# Patient Record
Sex: Female | Born: 1940 | Race: White | Hispanic: No | Marital: Single | Smoking: Never smoker
Health system: Southern US, Community
[De-identification: ages and names within clinical notes are randomized; demographics above are authoritative.]

## PROBLEM LIST (undated history)

## (undated) DIAGNOSIS — G4700 Insomnia, unspecified: Secondary | ICD-10-CM

## (undated) DIAGNOSIS — D649 Anemia, unspecified: Secondary | ICD-10-CM

## (undated) DIAGNOSIS — M1990 Unspecified osteoarthritis, unspecified site: Secondary | ICD-10-CM

## (undated) DIAGNOSIS — G8929 Other chronic pain: Secondary | ICD-10-CM

## (undated) DIAGNOSIS — F419 Anxiety disorder, unspecified: Secondary | ICD-10-CM

## (undated) DIAGNOSIS — M109 Gout, unspecified: Secondary | ICD-10-CM

## (undated) DIAGNOSIS — R569 Unspecified convulsions: Secondary | ICD-10-CM

## (undated) DIAGNOSIS — K5900 Constipation, unspecified: Secondary | ICD-10-CM

## (undated) DIAGNOSIS — F79 Unspecified intellectual disabilities: Secondary | ICD-10-CM

## (undated) DIAGNOSIS — E559 Vitamin D deficiency, unspecified: Secondary | ICD-10-CM

## (undated) DIAGNOSIS — I10 Essential (primary) hypertension: Secondary | ICD-10-CM

## (undated) DIAGNOSIS — S82891A Other fracture of right lower leg, initial encounter for closed fracture: Secondary | ICD-10-CM

## (undated) DIAGNOSIS — F32A Depression, unspecified: Secondary | ICD-10-CM

## (undated) DIAGNOSIS — F329 Major depressive disorder, single episode, unspecified: Secondary | ICD-10-CM

## (undated) DIAGNOSIS — E785 Hyperlipidemia, unspecified: Secondary | ICD-10-CM

## (undated) HISTORY — DX: Insomnia, unspecified: G47.00

## (undated) HISTORY — DX: Other chronic pain: G89.29

## (undated) HISTORY — DX: Anxiety disorder, unspecified: F41.9

## (undated) HISTORY — DX: Unspecified osteoarthritis, unspecified site: M19.90

## (undated) HISTORY — DX: Other fracture of right lower leg, initial encounter for closed fracture: S82.891A

## (undated) HISTORY — DX: Depression, unspecified: F32.A

## (undated) HISTORY — DX: Major depressive disorder, single episode, unspecified: F32.9

## (undated) HISTORY — DX: Unspecified convulsions: R56.9

## (undated) HISTORY — DX: Essential (primary) hypertension: I10

## (undated) HISTORY — DX: Unspecified intellectual disabilities: F79

## (undated) HISTORY — DX: Vitamin D deficiency, unspecified: E55.9

## (undated) HISTORY — DX: Hyperlipidemia, unspecified: E78.5

## (undated) HISTORY — DX: Anemia, unspecified: D64.9

## (undated) HISTORY — DX: Gout, unspecified: M10.9

## (undated) HISTORY — DX: Constipation, unspecified: K59.00

---

## 1997-08-06 ENCOUNTER — Emergency Department (HOSPITAL_COMMUNITY): Admission: EM | Admit: 1997-08-06 | Discharge: 1997-08-06 | Payer: Self-pay | Admitting: Emergency Medicine

## 1997-11-28 ENCOUNTER — Other Ambulatory Visit: Admission: RE | Admit: 1997-11-28 | Discharge: 1997-11-28 | Payer: Self-pay | Admitting: Family Medicine

## 1998-06-05 ENCOUNTER — Emergency Department (HOSPITAL_COMMUNITY): Admission: EM | Admit: 1998-06-05 | Discharge: 1998-06-05 | Payer: Self-pay | Admitting: Emergency Medicine

## 1998-12-11 ENCOUNTER — Emergency Department (HOSPITAL_COMMUNITY): Admission: EM | Admit: 1998-12-11 | Discharge: 1998-12-11 | Payer: Self-pay | Admitting: Emergency Medicine

## 1998-12-11 ENCOUNTER — Encounter: Payer: Self-pay | Admitting: Family Medicine

## 1998-12-26 ENCOUNTER — Ambulatory Visit (HOSPITAL_COMMUNITY): Admission: RE | Admit: 1998-12-26 | Discharge: 1998-12-26 | Payer: Self-pay | Admitting: Internal Medicine

## 1998-12-26 ENCOUNTER — Encounter: Payer: Self-pay | Admitting: Internal Medicine

## 1999-01-04 ENCOUNTER — Ambulatory Visit (HOSPITAL_COMMUNITY): Admission: RE | Admit: 1999-01-04 | Discharge: 1999-01-04 | Payer: Self-pay | Admitting: Internal Medicine

## 1999-01-04 ENCOUNTER — Encounter: Payer: Self-pay | Admitting: Internal Medicine

## 1999-02-19 ENCOUNTER — Ambulatory Visit (HOSPITAL_COMMUNITY): Admission: RE | Admit: 1999-02-19 | Discharge: 1999-02-19 | Payer: Self-pay | Admitting: Internal Medicine

## 1999-03-12 ENCOUNTER — Ambulatory Visit (HOSPITAL_COMMUNITY): Admission: RE | Admit: 1999-03-12 | Discharge: 1999-03-12 | Payer: Self-pay | Admitting: Internal Medicine

## 1999-03-12 ENCOUNTER — Encounter: Payer: Self-pay | Admitting: Internal Medicine

## 1999-05-21 ENCOUNTER — Encounter: Payer: Self-pay | Admitting: Family Medicine

## 1999-05-21 ENCOUNTER — Encounter: Admission: RE | Admit: 1999-05-21 | Discharge: 1999-05-21 | Payer: Self-pay | Admitting: Family Medicine

## 1999-05-30 ENCOUNTER — Encounter: Payer: Self-pay | Admitting: Family Medicine

## 1999-05-30 ENCOUNTER — Encounter: Admission: RE | Admit: 1999-05-30 | Discharge: 1999-05-30 | Payer: Self-pay | Admitting: Family Medicine

## 1999-06-17 ENCOUNTER — Other Ambulatory Visit: Admission: RE | Admit: 1999-06-17 | Discharge: 1999-06-17 | Payer: Self-pay | Admitting: Family Medicine

## 1999-06-21 ENCOUNTER — Encounter: Payer: Self-pay | Admitting: Family Medicine

## 1999-06-21 ENCOUNTER — Encounter: Admission: RE | Admit: 1999-06-21 | Discharge: 1999-06-21 | Payer: Self-pay | Admitting: Family Medicine

## 1999-07-10 ENCOUNTER — Encounter: Payer: Self-pay | Admitting: Family Medicine

## 1999-07-10 ENCOUNTER — Ambulatory Visit (HOSPITAL_COMMUNITY): Admission: RE | Admit: 1999-07-10 | Discharge: 1999-07-10 | Payer: Self-pay | Admitting: Family Medicine

## 1999-07-16 ENCOUNTER — Emergency Department (HOSPITAL_COMMUNITY): Admission: EM | Admit: 1999-07-16 | Discharge: 1999-07-16 | Payer: Self-pay | Admitting: Emergency Medicine

## 2000-07-17 ENCOUNTER — Encounter: Payer: Self-pay | Admitting: Family Medicine

## 2000-07-17 ENCOUNTER — Encounter: Admission: RE | Admit: 2000-07-17 | Discharge: 2000-07-17 | Payer: Self-pay | Admitting: Family Medicine

## 2000-08-31 ENCOUNTER — Other Ambulatory Visit: Admission: RE | Admit: 2000-08-31 | Discharge: 2000-08-31 | Payer: Self-pay | Admitting: Family Medicine

## 2001-05-26 ENCOUNTER — Emergency Department (HOSPITAL_COMMUNITY): Admission: EM | Admit: 2001-05-26 | Discharge: 2001-05-26 | Payer: Self-pay | Admitting: *Deleted

## 2001-06-30 ENCOUNTER — Encounter: Admission: RE | Admit: 2001-06-30 | Discharge: 2001-06-30 | Payer: Self-pay | Admitting: Family Medicine

## 2001-06-30 ENCOUNTER — Encounter: Payer: Self-pay | Admitting: Family Medicine

## 2001-11-19 ENCOUNTER — Ambulatory Visit (HOSPITAL_COMMUNITY): Admission: RE | Admit: 2001-11-19 | Discharge: 2001-11-19 | Payer: Self-pay | Admitting: Internal Medicine

## 2002-07-04 ENCOUNTER — Encounter: Admission: RE | Admit: 2002-07-04 | Discharge: 2002-07-04 | Payer: Self-pay | Admitting: Family Medicine

## 2002-07-04 ENCOUNTER — Encounter: Payer: Self-pay | Admitting: Family Medicine

## 2003-07-07 ENCOUNTER — Encounter: Admission: RE | Admit: 2003-07-07 | Discharge: 2003-07-07 | Payer: Self-pay | Admitting: Family Medicine

## 2003-09-14 ENCOUNTER — Ambulatory Visit (HOSPITAL_COMMUNITY): Admission: RE | Admit: 2003-09-14 | Discharge: 2003-09-14 | Payer: Self-pay | Admitting: Internal Medicine

## 2004-04-08 ENCOUNTER — Encounter (INDEPENDENT_AMBULATORY_CARE_PROVIDER_SITE_OTHER): Payer: Self-pay | Admitting: *Deleted

## 2004-04-08 ENCOUNTER — Ambulatory Visit (HOSPITAL_BASED_OUTPATIENT_CLINIC_OR_DEPARTMENT_OTHER): Admission: RE | Admit: 2004-04-08 | Discharge: 2004-04-08 | Payer: Self-pay | Admitting: Obstetrics and Gynecology

## 2004-04-08 ENCOUNTER — Ambulatory Visit (HOSPITAL_COMMUNITY): Admission: RE | Admit: 2004-04-08 | Discharge: 2004-04-08 | Payer: Self-pay | Admitting: Obstetrics and Gynecology

## 2004-08-08 ENCOUNTER — Encounter: Admission: RE | Admit: 2004-08-08 | Discharge: 2004-08-08 | Payer: Self-pay | Admitting: Family Medicine

## 2005-08-11 ENCOUNTER — Encounter: Admission: RE | Admit: 2005-08-11 | Discharge: 2005-08-11 | Payer: Self-pay | Admitting: Family Medicine

## 2006-02-18 ENCOUNTER — Emergency Department (HOSPITAL_COMMUNITY): Admission: EM | Admit: 2006-02-18 | Discharge: 2006-02-19 | Payer: Self-pay | Admitting: Emergency Medicine

## 2006-02-23 ENCOUNTER — Ambulatory Visit: Payer: Self-pay | Admitting: Family Medicine

## 2006-02-23 ENCOUNTER — Inpatient Hospital Stay (HOSPITAL_COMMUNITY): Admission: EM | Admit: 2006-02-23 | Discharge: 2006-02-26 | Payer: Self-pay | Admitting: Emergency Medicine

## 2006-02-24 ENCOUNTER — Encounter (INDEPENDENT_AMBULATORY_CARE_PROVIDER_SITE_OTHER): Payer: Self-pay | Admitting: Cardiology

## 2006-03-20 ENCOUNTER — Ambulatory Visit (HOSPITAL_COMMUNITY): Admission: RE | Admit: 2006-03-20 | Discharge: 2006-03-20 | Payer: Self-pay | Admitting: Internal Medicine

## 2006-09-11 ENCOUNTER — Encounter: Admission: RE | Admit: 2006-09-11 | Discharge: 2006-09-11 | Payer: Self-pay | Admitting: Internal Medicine

## 2006-09-12 ENCOUNTER — Emergency Department (HOSPITAL_COMMUNITY): Admission: EM | Admit: 2006-09-12 | Discharge: 2006-09-12 | Payer: Self-pay | Admitting: Emergency Medicine

## 2006-09-21 ENCOUNTER — Encounter: Admission: RE | Admit: 2006-09-21 | Discharge: 2006-09-21 | Payer: Self-pay | Admitting: Internal Medicine

## 2007-09-22 ENCOUNTER — Encounter: Admission: RE | Admit: 2007-09-22 | Discharge: 2007-09-22 | Payer: Self-pay | Admitting: Family Medicine

## 2008-01-11 ENCOUNTER — Encounter
Admission: RE | Admit: 2008-01-11 | Discharge: 2008-01-11 | Payer: Self-pay | Admitting: Physical Medicine and Rehabilitation

## 2008-05-04 IMAGING — CR DG FEMUR 2+V*R*
4 series · 4 of 4 positions shown · non-contrast
Comparison: none

CLINICAL DATA: Fall, midshaft pain

RIGHT FEMUR - 2  VIEW:

[t femur with hip  ap right]
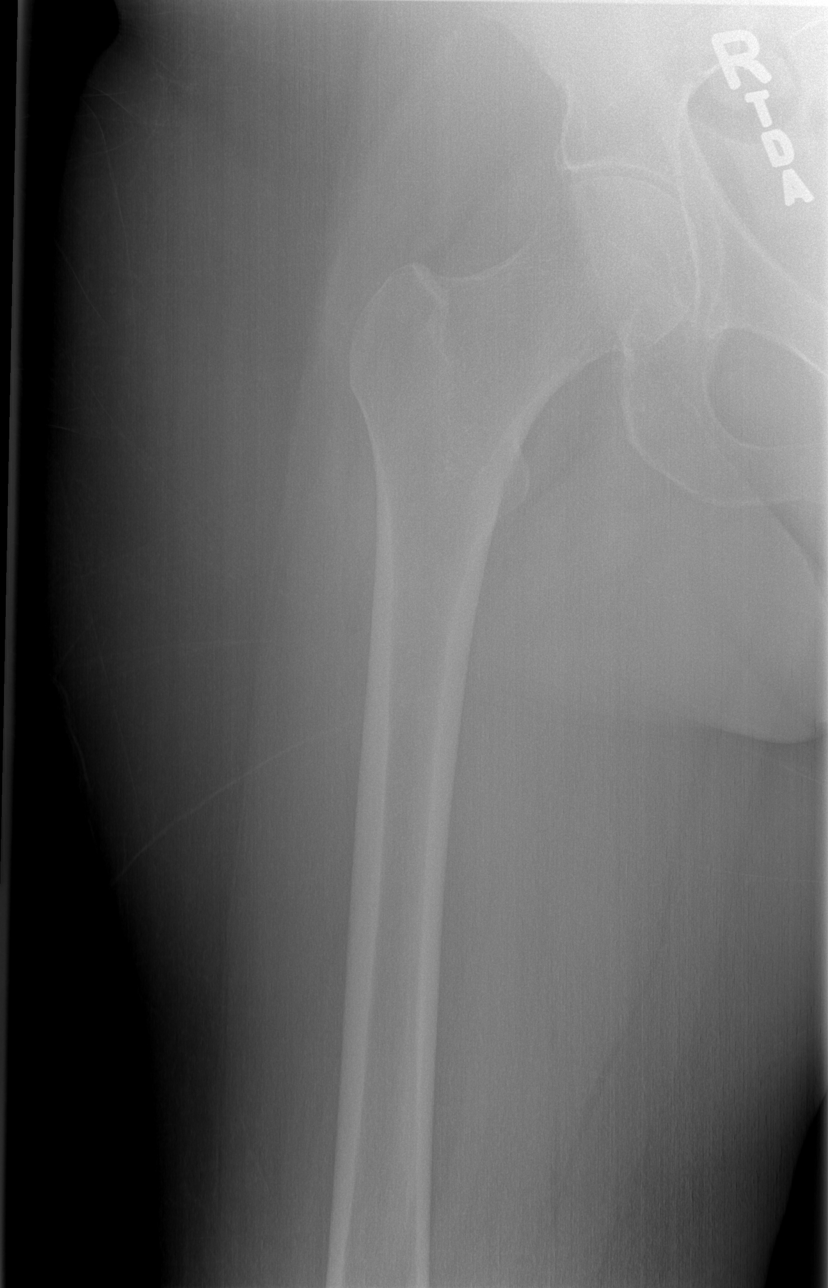

[t femur with knee ap right]
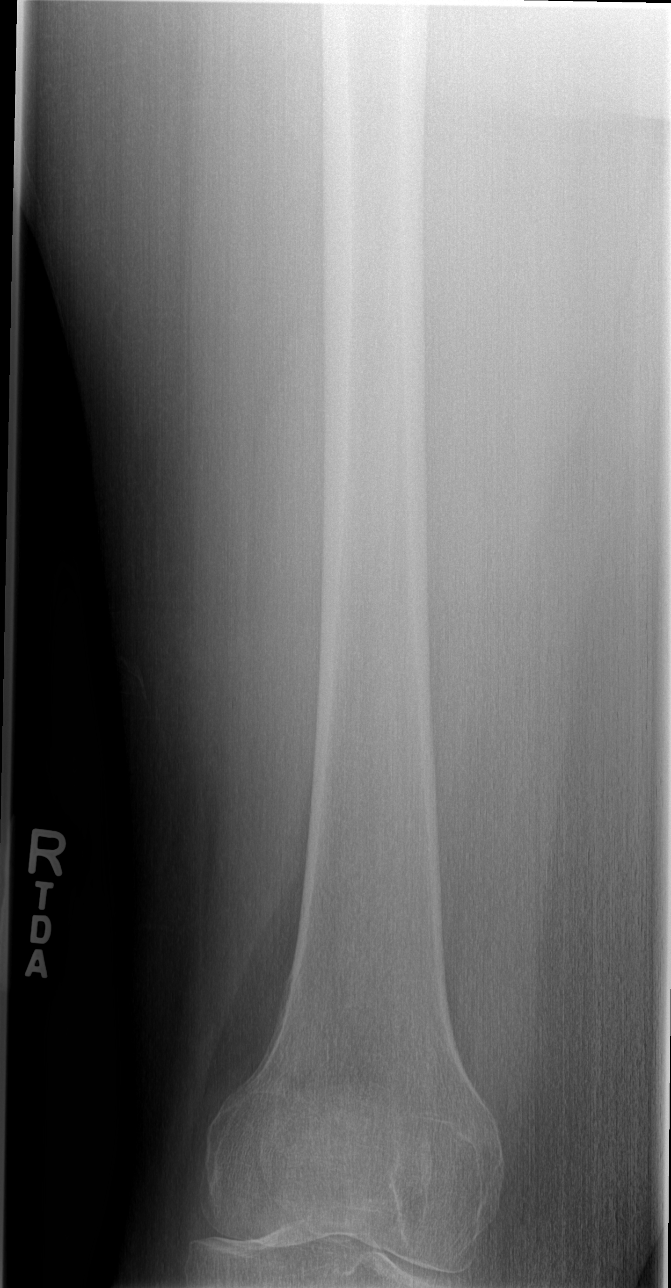

[t femur with hip lat right *]
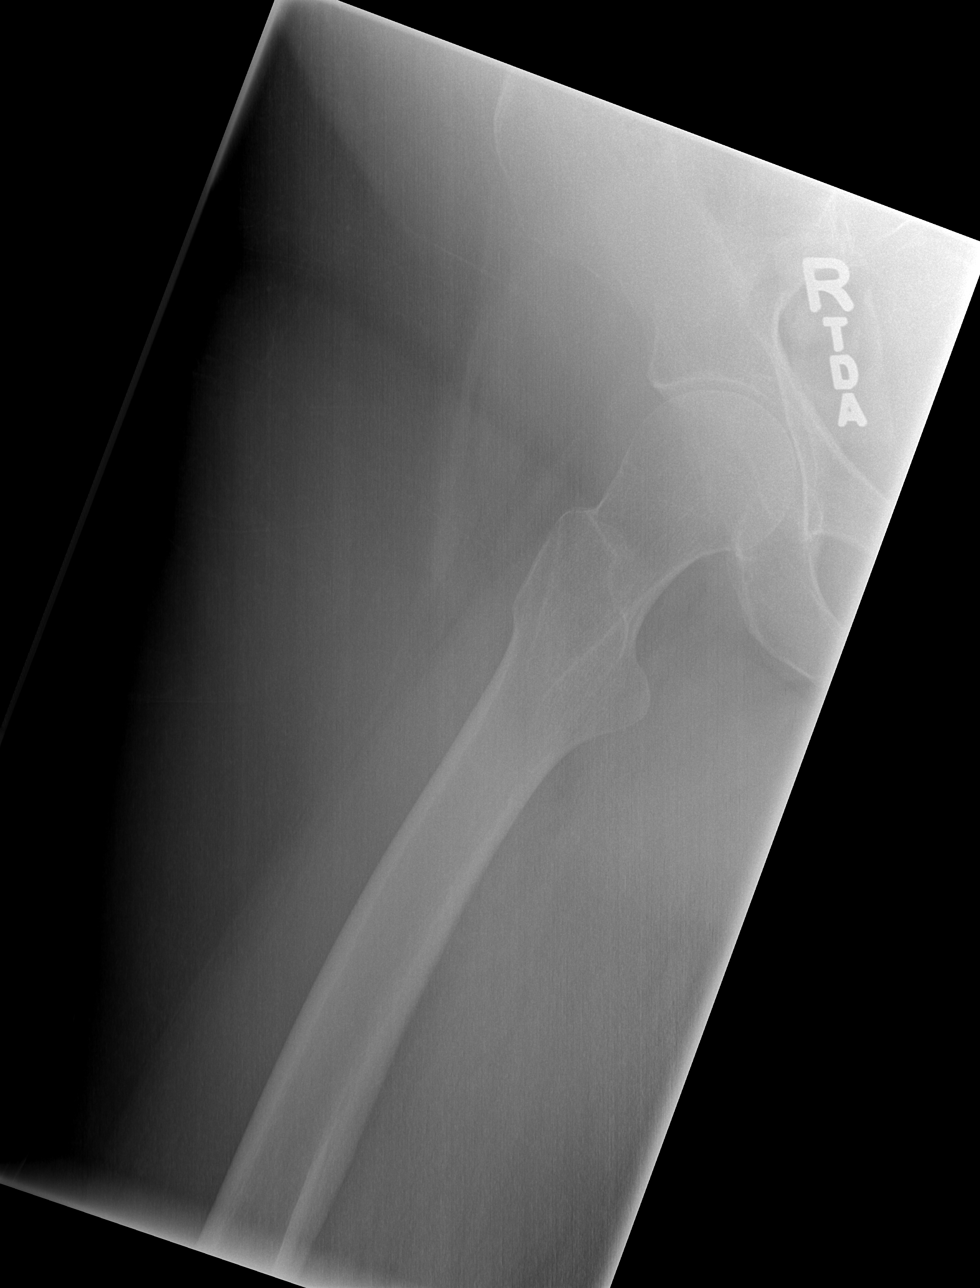

[t femur with knee lat right]
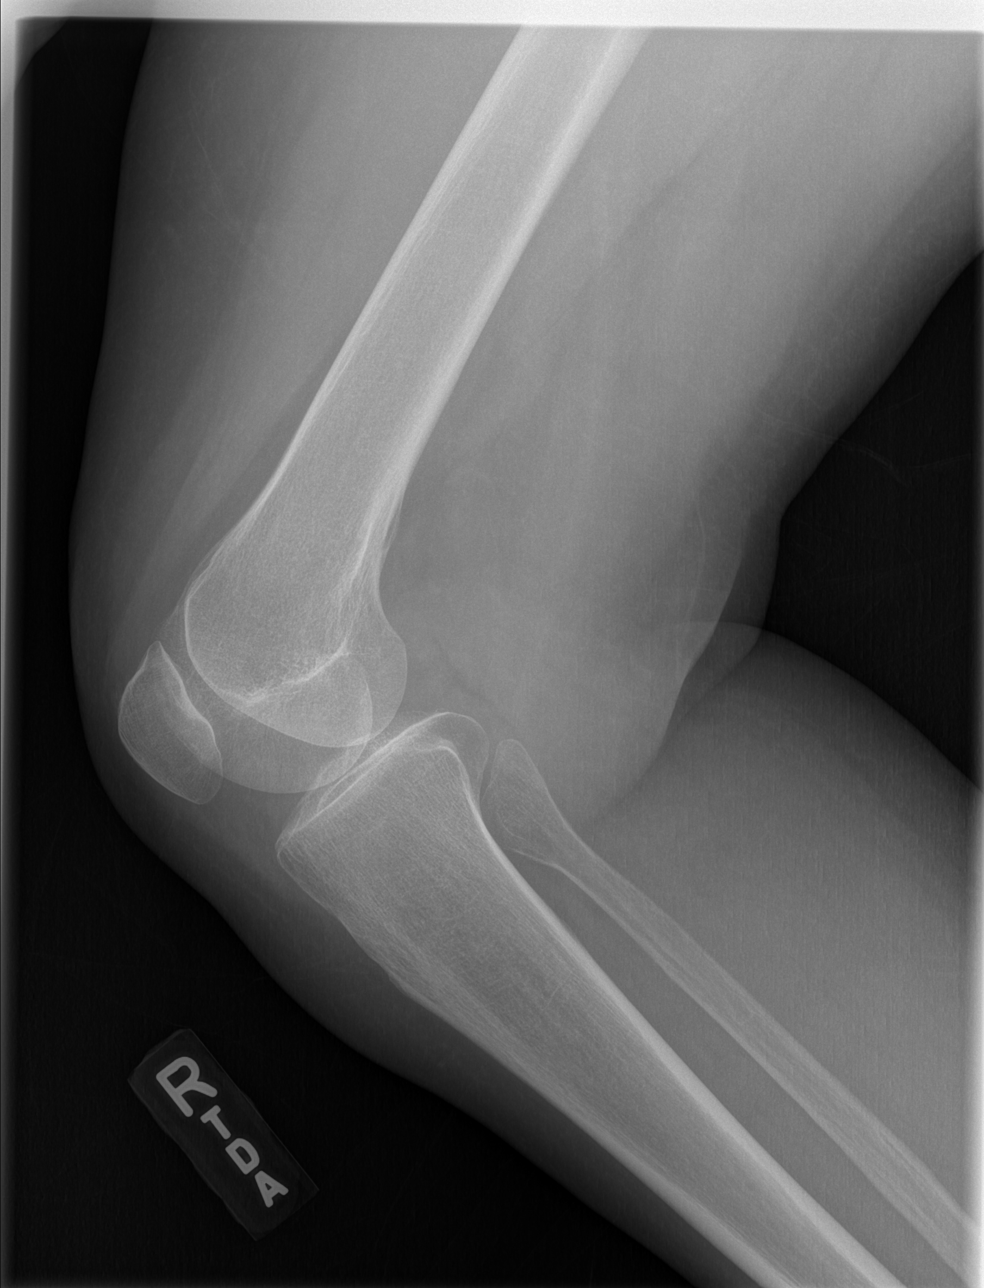

[4 of 4 positions shown; findings below may reference images not displayed]

FINDINGS: There is no evidence of fracture or other focal bone lesions.  Soft
tissues are unremarkable.
IMPRESSION: Negative.

## 2008-05-04 IMAGING — CR DG HIP COMPLETE 2+V*R*
2 series · 2 of 2 positions shown · non-contrast
Comparison: none

CLINICAL DATA: Fall, right hip pain

RIGHT HIP - 2  VIEW:

[t pelvis a.p.]
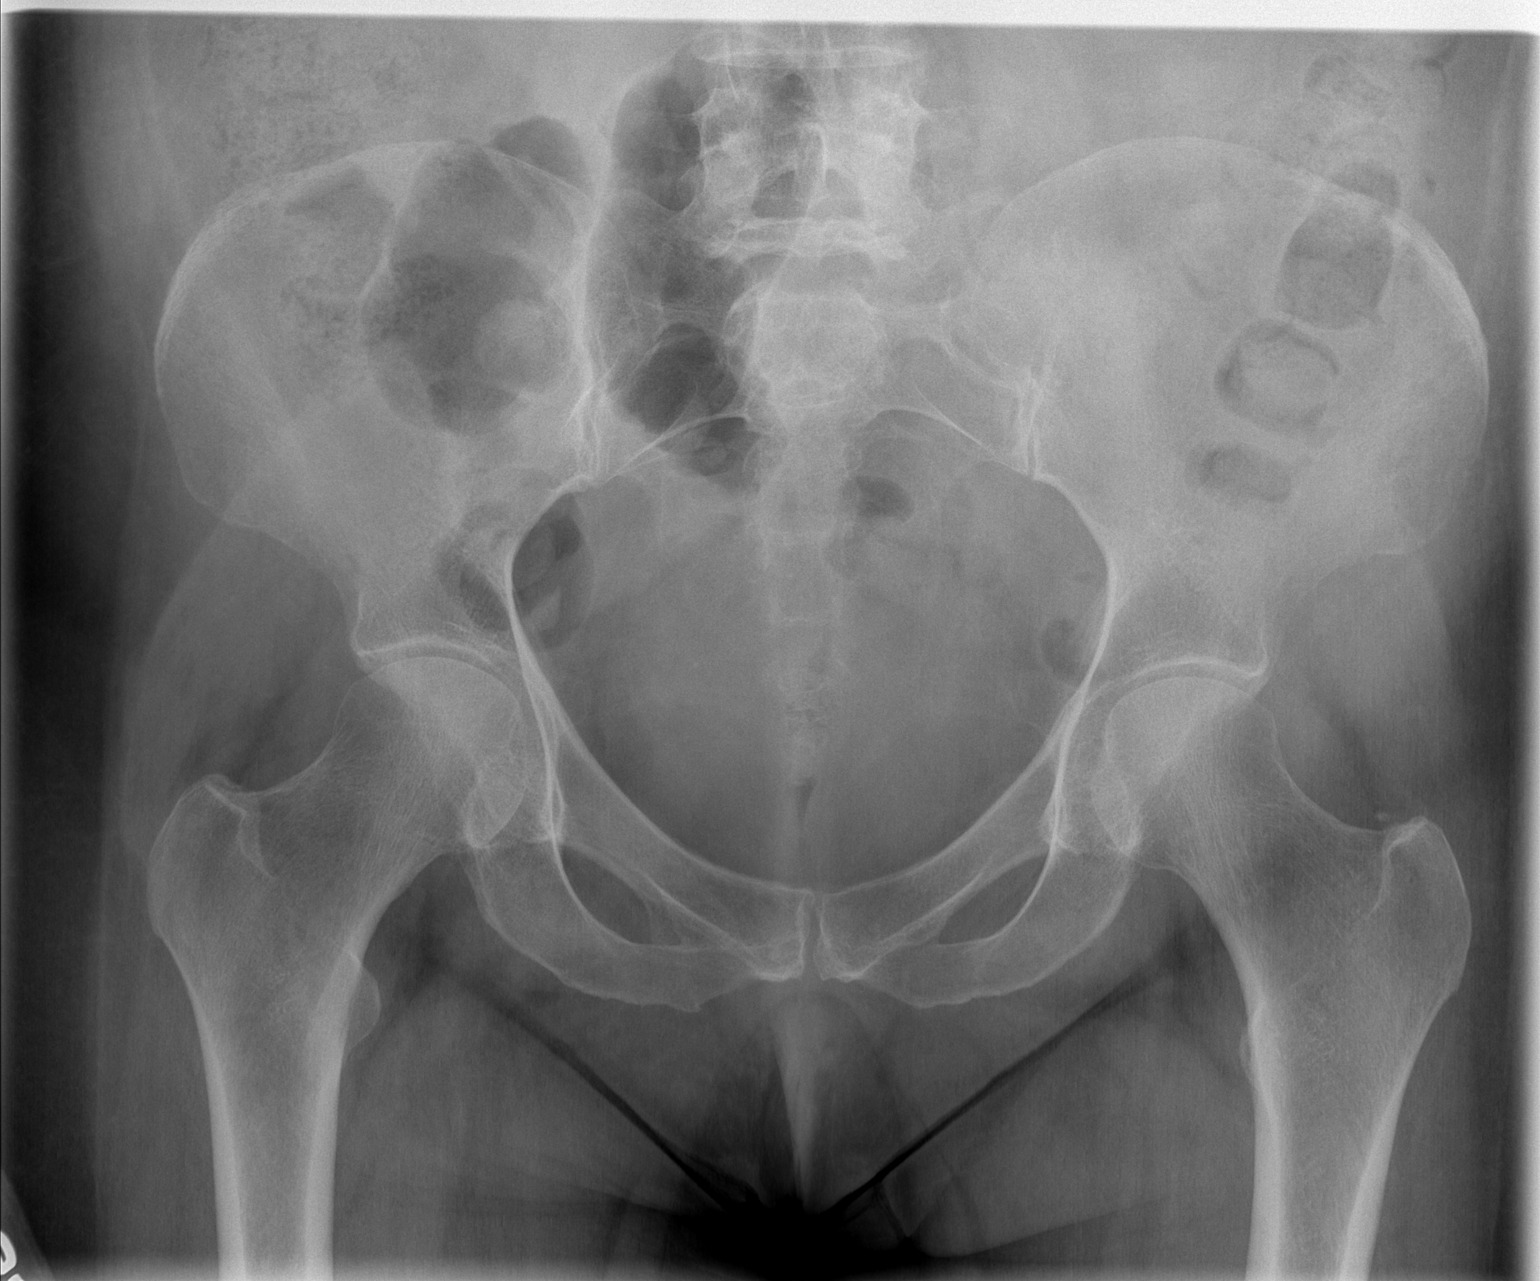

[t hip ap right]
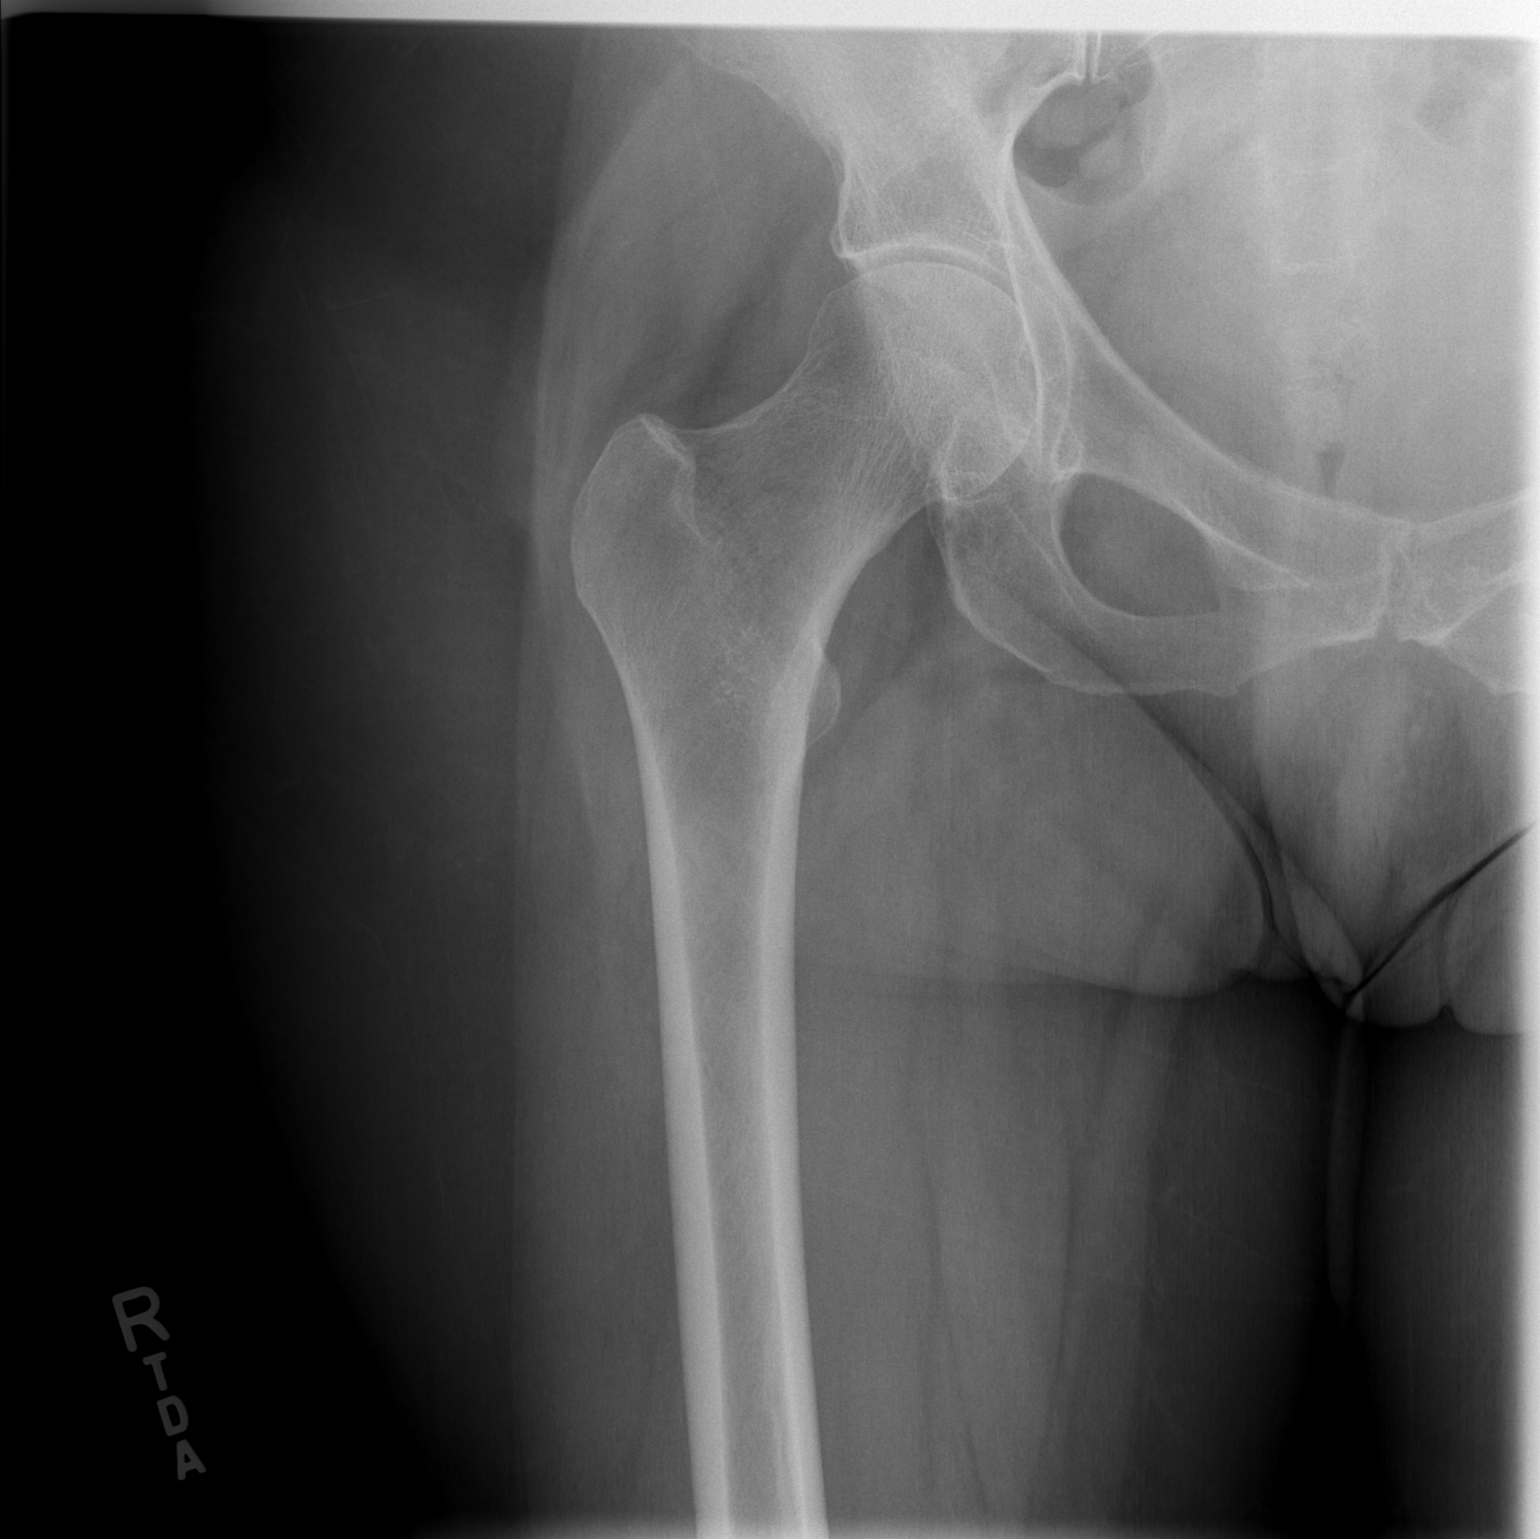

[2 of 2 positions shown; findings below may reference images not displayed]

FINDINGS: There is no evidence of hip fracture or dislocation.  There is no
evidence of arthropathy or other focal bone abnormality.
IMPRESSION: Negative.

## 2008-09-22 ENCOUNTER — Encounter: Admission: RE | Admit: 2008-09-22 | Discharge: 2008-09-22 | Payer: Self-pay | Admitting: Internal Medicine

## 2008-10-20 ENCOUNTER — Inpatient Hospital Stay (HOSPITAL_COMMUNITY): Admission: EM | Admit: 2008-10-20 | Discharge: 2008-10-23 | Payer: Self-pay | Admitting: Emergency Medicine

## 2009-11-02 ENCOUNTER — Encounter: Admission: RE | Admit: 2009-11-02 | Discharge: 2009-11-02 | Payer: Self-pay | Admitting: Internal Medicine

## 2010-04-21 ENCOUNTER — Encounter: Payer: Self-pay | Admitting: Physical Medicine and Rehabilitation

## 2010-06-13 IMAGING — CR DG TIBIA/FIBULA 2V*R*
2 series · 2 of 2 positions shown · non-contrast
Comparison: 10/20/2008.

CLINICAL DATA: Post reduction.

RIGHT TIBIA AND FIBULA - 2 VIEW

[view not recorded (1 of 2)]
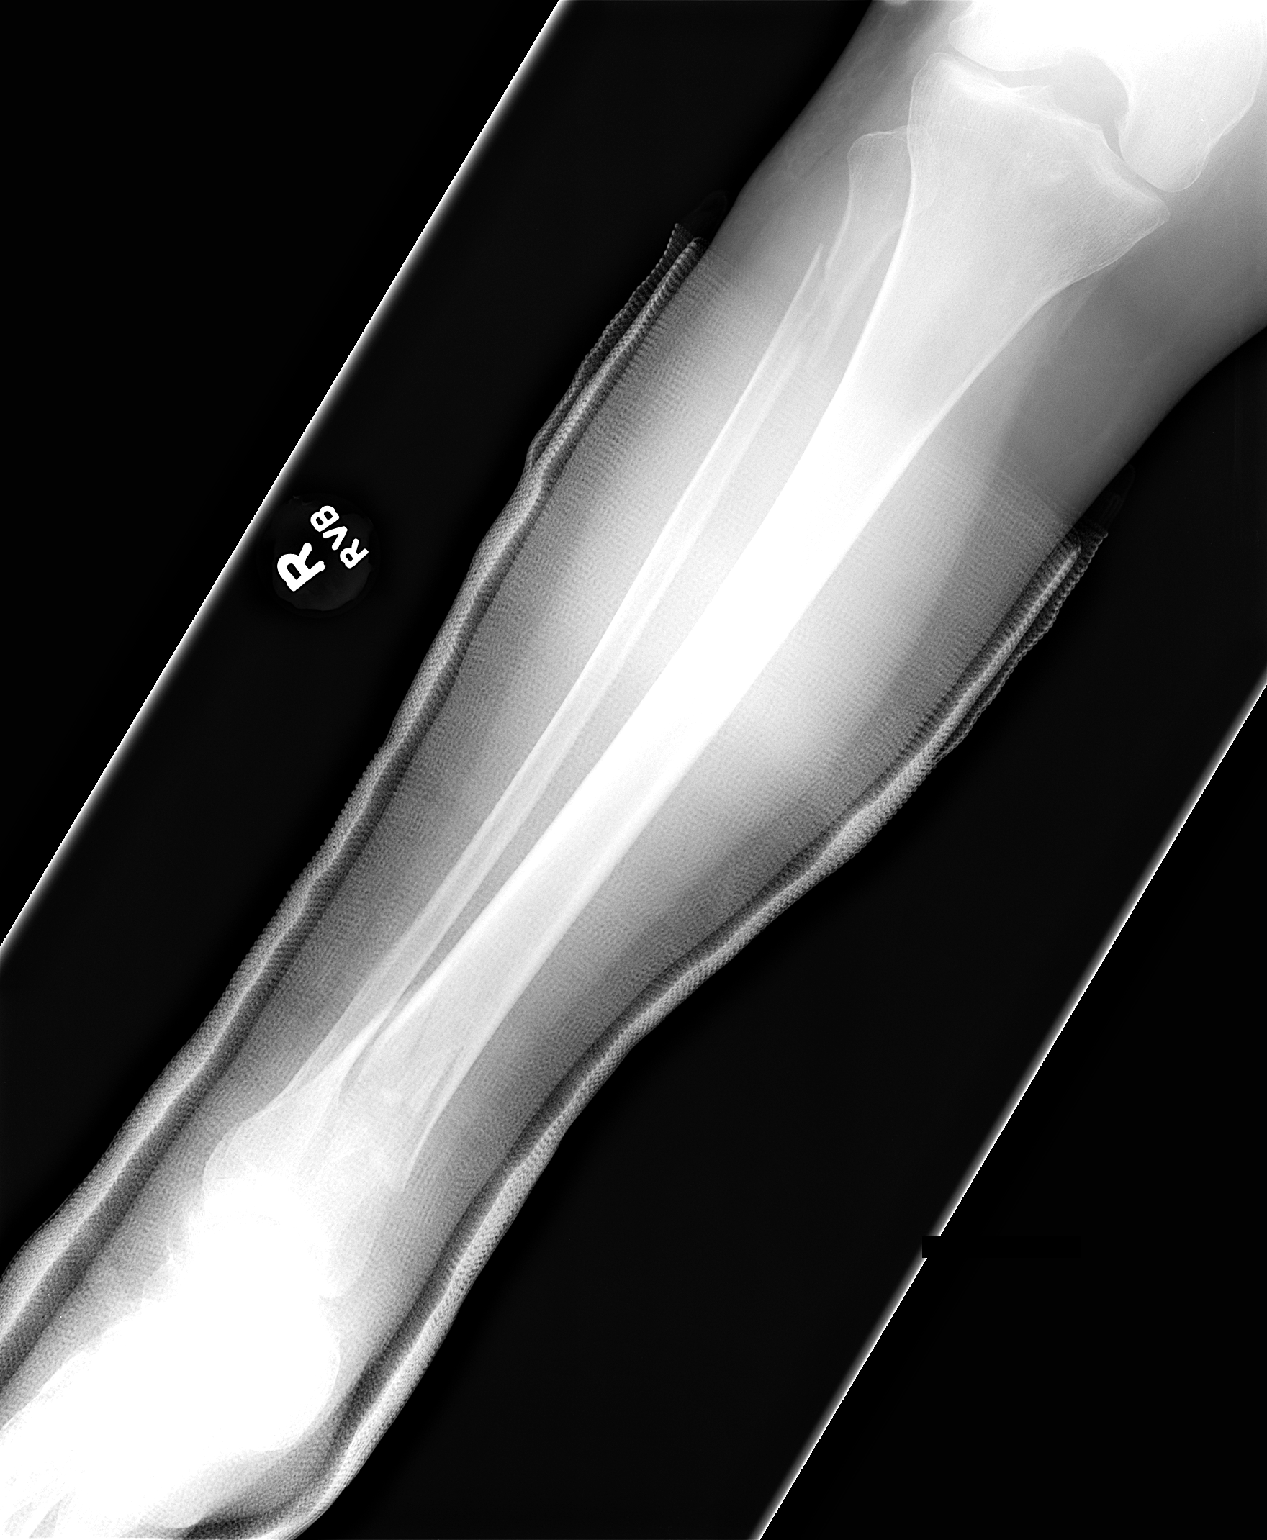

[view not recorded (2 of 2)]
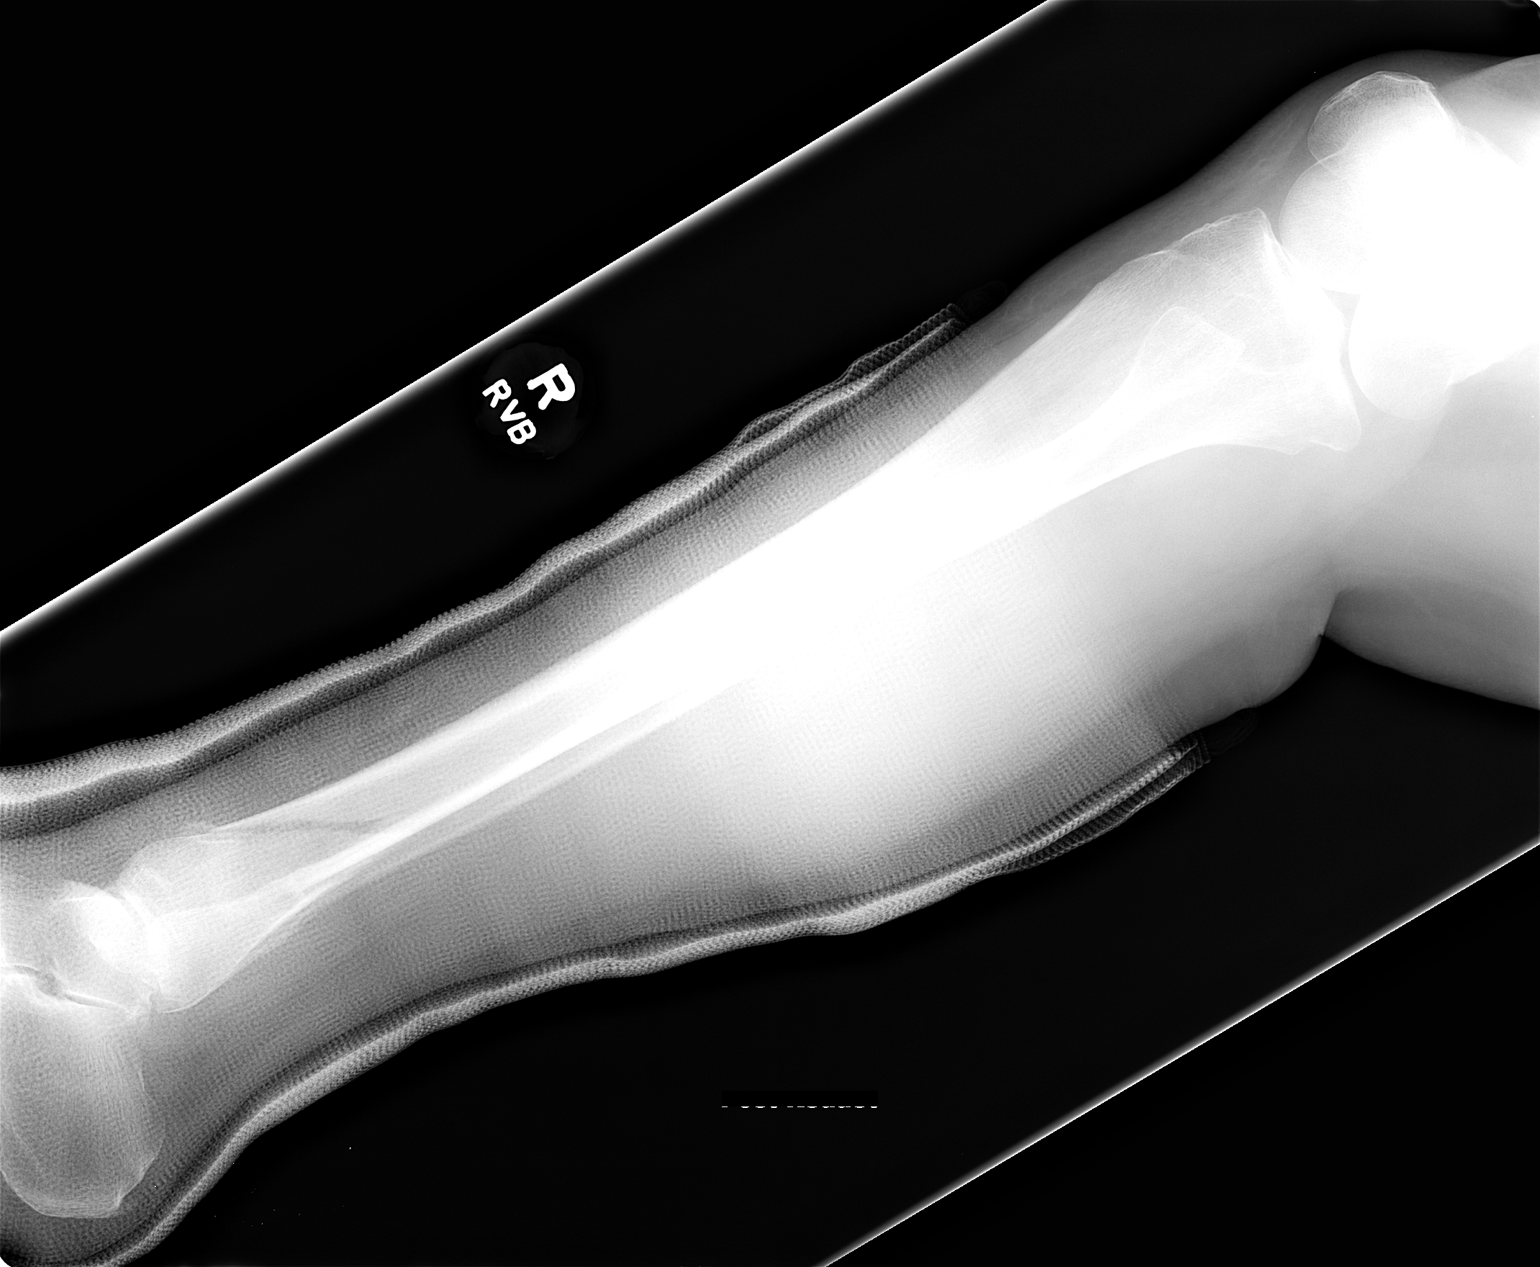

[2 of 2 positions shown; findings below may reference images not displayed]

FINDINGS: A fiberglass cast is in place.  There is a mildly
displaced fracture of the proximal fibula, unchanged.  There is a
comminuted fracture of the distal tibia.  There is improvement in
the lateral displacement of the tibial fracture.
IMPRESSION: Improved displacement of the distal tibial fracture following
reduction and casting.  No change in the mildly displaced proximal
fibular fracture.

## 2010-07-07 LAB — CBC
MCV: 98.4 fL (ref 78.0–100.0)
RBC: 3.92 MIL/uL (ref 3.87–5.11)
WBC: 7.3 10*3/uL (ref 4.0–10.5)

## 2010-07-07 LAB — BASIC METABOLIC PANEL
Chloride: 100 mEq/L (ref 96–112)
Creatinine, Ser: 0.87 mg/dL (ref 0.4–1.2)
GFR calc Af Amer: >60 mL/min (ref >60)
Potassium: 4.3 mEq/L (ref 3.5–5.1)
Sodium: 139 mEq/L (ref 135–145)

## 2010-07-07 LAB — DIFFERENTIAL
Eosinophils Absolute: 0.1 10*3/uL (ref 0.0–0.7)
Lymphs Abs: 1.6 10*3/uL (ref 0.7–4.0)
Monocytes Relative: 5 % (ref 3–12)
Neutrophils Relative %: 71 % (ref 43–77)

## 2010-07-07 LAB — PROTIME-INR: INR: 1 (ref 0.00–1.49)

## 2010-07-07 LAB — TYPE AND SCREEN
ABO/RH(D): O NEG
Antibody Screen: NEGATIVE

## 2010-08-13 NOTE — Discharge Summary (Signed)
NAMETRISTIN, GLADMAN               ACCOUNT NO.:  [id number]   MEDICAL RECORD NO.:  [id number]          PATIENT TYPE:  INP   LOCATION:  1602                         FACILITY:  [facility]   PHYSICIAN:  Mark C. Ophelia Charter, M.D.    DATE OF BIRTH:  1941-03-11   DATE OF ADMISSION:  10/20/2008  DATE OF DISCHARGE:  10/23/2008                               DISCHARGE SUMMARY   FINAL DIAGNOSIS:  Fall with right tibia and fibula fracture.   This is a 70 year old female with mild mental retardation who is a  skilled nursing facility resident was admitted with injury to the right  ankle when she slipped and fell.  X-rays taken in the emergency room  demonstrated a tib-fib fracture with proximal fibula fracture just below  the knee, and a distal tibial metaphyseal fracture which did not have  intraarticular extension.  The patient basically does transfers.   ADMISSION MEDICATIONS:  Reviewed for medical reconciliation and all  medications that she was on under his help were ordered with the  exception of the Vicodin 1 p.o. q.6 p.r.n. pain.  This was increased to  1-2 p.o. q.6 h. p.r.n. pain.  She initially also received IV pain  medication if needed.  In the emergency room, a short-leg fiberglass  cast was applied.  She is admitted to the hospital for elevation should  no problems with compartment syndrome.  Tolerated the cast well and had  good relief of pain with the cast.   CONDITION AT DISCHARGE:  Improved.   MEDICATIONS AT DISCHARGE:  Exactly the same as admission to the nursing  home which includes Carbatrol, phenobarbital, Colace, Lasix, Oxytrol,  allopurinol, pindolol, vitamin D, calcium, trazodone, Indocin, Toprol,  and p.r.n. Tylenol.  Hydrocodone will be 1-2 p.o. q.6 h. p.r.n. for pain  x3 weeks, then she can go back to her normal p.r.n. hydrocodone 1 every  6 hours.  She needed to be seen in the office of Dr. Ophelia Charter in 3 weeks,  office number is [phone].  She will have to be monitored, make  sure she  does not develop constipation with the narcotic pain medicine.  She will  need to have her foot elevated above her heart when she is in bed  propped up on some pillows to help with the swelling associated with the  fracture.  When she returns to my office in three weeks, we will obtain  x-rays, check cast fit, and assess alignment.  Condition at discharge  was stable.   PROCEDURES:  Reduction and casting.      Mark C. Ophelia Charter, M.D.  Electronically Signed     MCY/MEDQ  D:  10/22/2008  T:  10/22/2008  Job:  [id number]

## 2010-08-16 NOTE — Op Note (Signed)
[name], [name]               ACCOUNT NO.:  [id number]   MEDICAL RECORD NO.:  [id number]           PATIENT TYPE:   LOCATION:                                 FACILITY:   PHYSICIAN:  Cynthia P. Romine, M.D.DATE OF BIRTH:  [birth year]   DATE OF PROCEDURE:  DATE OF DISCHARGE:                                 OPERATIVE REPORT   PREOPERATIVE DIAGNOSES:  1.  Postmenopausal bleeding.  2.  Known endometrial polyp.   POSTOPERATIVE DIAGNOSES:  1.  Postmenopausal bleeding.  2.  Known endometrial polyp.  3.  Pathology pending.   PROCEDURES:  Hysteroscopic resection of endometrial polyp.   SURGEON:  Cynthia P. Romine, M.D.   ANESTHESIA:  General.   ESTIMATED BLOOD LOSS:  Minimal.   SORBITOL DEFICIT:  30 mL.   COMPLICATIONS:  None.   DESCRIPTION OF PROCEDURE:  The patient was taken to the operating room and  after the induction of general anesthesia was placed in the dorsolithotomy  position and prepped and draped in the usual fashion.  Her bladder was  drained with a __________ catheter.  A posterior weighted and anterior  Simons retractor were placed and the cervix was grasped at the anterior lip  with a single-tooth tenaculum.  The cervix was dilated to a #21 Shawnie Pons.  The  uterus was sounded to 7 cm.  The operative hysteroscope was introduced.  Sorbitol was used as a distention medium and the Sorbitol pressure was set  at 70 mmHg.  Hysteroscopy revealed there to be an anterior fundal polyp as  had been seen on sonohysterogram.  The polyp was resected in several pieces  using a single loop without difficulty.  The rest of the endometrium  appeared very atrophic.  Upon completion of the removal of the polyp, the  hysteroscope was withdrawn.  The instruments were removed from the vagina  and the procedure was terminated.  The patient tolerated it well and went in  satisfactory condition to postanesthesia recovery.       ___________________________________________  Edwena Felty.  Romine, M.D.    CPR/MEDQ  D:  04/08/2004  T:  04/08/2004  Job:  [id number]

## 2010-08-16 NOTE — Discharge Summary (Signed)
NAMEJOSCLYN, [name]               ACCOUNT NO.:  [id number]   MEDICAL RECORD NO.:  [id number]          PATIENT TYPE:  INP   LOCATION:  3705                         FACILITY:  [facility]   PHYSICIAN:  Leighton Roach McDiarmid, M.D.DATE OF BIRTH:  [birth year]   DATE OF ADMISSION:  02/23/2006  DATE OF DISCHARGE:  02/25/2006                                 DISCHARGE SUMMARY   DISCHARGE DIAGNOSES:  1. Dizziness, likely secondary to vertigo.  2. Mental retardation.  3. Seizure disorder.  4. Status post rib fracture in November 2007.   DISCHARGE MEDICATIONS:  1. Carbatrol 300 mg by mouth b.i.d.  2. Klonopin 0.5 mg p.o. once daily as needed.  3. Isordil Extended Release 30 mg p.o. daily.  4. Meclizine 25 mg by mouth once daily.  Please note, this is a decreased      dose from the patient's admission dose.  5. Phenobarbital 97.5 mg p.o. q.h.s.  6. Oxybutynin 5 mg p.o. every 6 hours.  7. Colace 100 mg p.o. daily.   CONSULTATIONS:  None.   PROCEDURES:  The patient had a chest x-ray that showed right basilar  atelectasis versus infiltrate.  There was soft tissue prominence in the  right hilar region but could not rule out mass or adenopathy.  A follow-up  chest x-ray is recommended.  The patient also had a CT of the head on  admission that showed no acute findings.   FOLLOW-UP INSTRUCTIONS:  The patient is being discharged to a skilled  nursing facility for short-term placement.  She should follow up with her  primary physician, who is Windle Guard, M.D., and has been instructed to  call to make an appointment.  At that time she should have a follow-up chest  x-ray given her hilar prominence.   HOSPITAL COURSE:  Problem 1.  [name] is a 70 year old white female with a longstanding  history of mental retardation and a seizure disorder.  She was admitted for  a 2-week history of unsteady gait secondary to dizziness.  Upon further  questioning she stated that the dizziness felt that the room  was spinning.  She had been started on meclizine approximately 3 weeks prior to admission  at a dose of 25 mg every 6 hours.  This dose was decreased to 25 mg daily  and she reported much improved dizziness at the time of discharge.  Meclizine itself at a higher dose may contribute to dizziness given its  anticholinergic effects.  She had a head CT to rule out any neurological  cause for her dizziness.  This study was negative.  Also her tympanic  membranes were clear with no evidence of acute otitis media to cause her  dizziness.  Orthostatic blood pressures were negative.  She also had an  echocardiogram that showed an ejection fraction of 60-70% with no wall  motion abnormalities.  Of note, there was mild to moderate pulmonic valve  regurgitation and a small pericardial effusion.  At the time of discharge  the patient is slightly unsteady on her feet and requires help with  ambulation.  She is also  incontinent of urine, which has been her baseline  for the past month.   Problem 2.  SEIZURE DISORDER:  The patient was continued on her home  medication regimen.  Her primary neurologist is Dr. Thad Ranger.  Her  phenobarbital level was within the therapeutic range at 26.7.  She will be  continued on her home medications as recommended by Dr. Thad Ranger.     ______________________________  Sylvan Cheese, M.D.    ______________________________  Leighton Roach McDiarmid, M.D.    MJ/MEDQ  D:  02/25/2006  T:  02/25/2006  Job:  [id number]   cc:   Windle Guard, M.D.  Michael L. Thad Ranger, M.D.

## 2010-08-16 NOTE — H&P (Signed)
[name], [name]               ACCOUNT NO.:  [id number]   MEDICAL RECORD NO.:  [id number]          PATIENT TYPE:  INP   LOCATION:  3705                         FACILITY:  [facility]   PHYSICIAN:  Benn Moulder, M.D.      DATE OF BIRTH:  Oct 11, 1940   DATE OF ADMISSION:  02/23/2006  DATE OF DISCHARGE:                                HISTORY & PHYSICAL   PRIMARY CARE PHYSICIAN:  Windle Guard, MD.   NEUROLOGIST:  Dr. Thad Ranger.   CHIEF COMPLAINT:  Weakness.   HISTORY OF PRESENT ILLNESS:  Patient is a 70 year old female with a history  of mental retardation and seizure disorder who presented to the emergency  department today because of increasing weakness.  For the past several  months she has been becoming increasingly weaker with frequent falls.  She  had a fall 2 months ago in which she fractured her right ankle.  For the  past week she has become even increasingly more weak to the point of being  unable to ambulate.  She had a fall on February 19, 2006, and went to the  emergency department that night and was noted to have right-sided eighth and  ninth rib fractures.  She has had pain on her right side since that time but  has been without any respiratory difficulty.  Her baseline mental status is  very poor and she needs assistance with most of her ADLs.  She is cared for  by her 89 year old mother who is too weak to continue caring for the patient  with this new weakness.  In regard to the patient's seizures, she has a  history of atonic seizures.  Her last seizure was 2 weeks ago.  She had been  seizure free for almost 1 year on phenobarbital prior to this last seizure.  Her falls have not been associated with seizure.  She has had no mental  status changes and no loss of consciousness before, during, or after the  seizures.   REVIEW OF SYSTEMS:  No chest pain, no shortness of breath, no abdominal  pain, no edema, no increasing urinary frequency, no incontinence, no melena,  hematochezia, no fevers.  Patient does have irregular bowel movements.   PAST MEDICAL HISTORY:  1. Mental retardation since childhood.  2. Seizure disorder.  3. Ankle fracture 2 months ago.  4. Urinary tract infection 2 weeks ago; she finished a course of      antibiotics.   MEDICATIONS:  Of note, do not have current dosages.  1. Phenobarbital.  2. Clonazepam.  3. Meclizine.  4. Medroxyprogesterone.  5. Premarin.  6. Vicodin as needed.   ALLERGIES:  NO KNOWN DRUG ALLERGIES.   FAMILY HISTORY:  Mom and sister with diabetes mellitus.  Dad died of  pancreatic cancer.  A sister also has hypertension.   SOCIAL HISTORY:  She lives with her mom as above.  Her sister also lives in  [place].  She has no history of alcohol, tobacco, or other drug use.   PHYSICAL EXAM:  VITALS:  Temperature 98.6, pulse 62, blood pressure 160/70,  respiratory rate  18, 98% on room air.  GENERAL:  She is alert and oriented to person, place, but not date.  HEENT:  Mucous membranes are moist.  NECK:  No lymphadenopathy.  No JVD.  No thyromegaly.  No thyroid nodules.  CARDIOVASCULAR:  Regular rate and rhythm.  Positive 2/6 systolic ejection  murmur over aortic area.  LUNGS:  Clear to auscultation bilaterally.  Good air movement.  No increased  work of breathing.  ABDOMEN:  Positive bowel sounds.  Soft, nontender, nondistended.  Abdomen is  protuberant but not tympanotic.  EXTREMITIES:  No signs of clubbing or edema, 2+ dorsalis pedis and posterior  tibial pulses bilaterally.  SKIN:  Positive ecchymosis on back and buttocks.  NEURO:  Cranial nerves II through XII grossly intact.  Patient with 5/5  upper and lower extremity strength throughout.  2+ DTRs.  Sensation is  intact.  Finger to nose intact.   LABORATORY DATA:  Phenobarbital level 27.6.  White blood cell count 5.4,  hemoglobin 14.3, platelet count 295.  Sodium 133, potassium 4.6, chloride  96, bicarb 29, BUN 17, creatinine 0.7, glucose 105.   Urinalysis:  Negative  nitrite, negative leukocyte esterase, specific gravity of 1.019.   EKG showed normal sinus rhythm at 61 beats/minute without ST segment  changes.   CT of head showed no acute findings.  It did show positive generalized  atrophy.   Chest x-ray showed right basilar atelectasis versus infiltrate as well as  soft tissue prominence of right hilum.   CT of chest on November 22nd showed cardiomegaly with pulmonary venous  hypertension.  Small nondisplaced fractures were noted at right eighth and  ninth ribs.  There was also some passive atelectasis.   ASSESSMENT AND PLAN:  A 70 year old female with mental retardation, seizure  disorder, now with weakness.  1. Weakness.  No obvious source of this based on exam and labs.  We will      consult Physical Therapy and Occupational Therapy.  She will need      placement, so we will consult Social Work.  We will also check cardiac      enzymes x1 as well as TSH and follow telemetry overnight to rule out      other possible causes.  We will place the patient on fall precautions.  2. Seizure disorder.  Family describes atonic seizures.  We will obtain      medication list from her primary care physician.  Her phenobarbital      level is currently therapeutic.  She has had all of her medications      this morning.  We will touch base with Dr. Thad Ranger tomorrow morning.  3. Cardiovascular.  Systolic murmur, __________ aortic stenosis.  Patient      also has pulmonary venous hypertension on CT (computed tomography) of      her chest last week as well as cardiomegaly.  We will check a 2-D      echocardiogram.  Also, checking cardiac enzymes and monitor with      telemetry as above.  4. Rib fractures.  We will use nonsteroidal antiinflammatories as needed      for pain control.  5. Constipation.  Colace 100 mg once daily.  DISPOSITION:  Once workup complete, patient will likely need placement in a  nursing home or a short-term  skilled nursing facility.      Benn Moulder, M.D.     MR/MEDQ  D:  02/23/2006  T:  02/24/2006  Job:  [id number]   cc:   Windle Guard, M.D.  Michael L. Thad Ranger, M.D.

## 2010-12-19 ENCOUNTER — Emergency Department (HOSPITAL_COMMUNITY)
Admission: EM | Admit: 2010-12-19 | Discharge: 2010-12-19 | Disposition: A | Discharge status: Home / Self Care | Payer: Medicare Other | Attending: Emergency Medicine | Admitting: Emergency Medicine

## 2010-12-19 DIAGNOSIS — Z79899 Other long term (current) drug therapy: Secondary | ICD-10-CM | POA: Insufficient documentation

## 2010-12-19 DIAGNOSIS — R32 Unspecified urinary incontinence: Secondary | ICD-10-CM | POA: Insufficient documentation

## 2010-12-19 DIAGNOSIS — G40909 Epilepsy, unspecified, not intractable, without status epilepticus: Secondary | ICD-10-CM | POA: Insufficient documentation

## 2010-12-19 DIAGNOSIS — F79 Unspecified intellectual disabilities: Secondary | ICD-10-CM | POA: Insufficient documentation

## 2010-12-19 LAB — CBC
HCT: 38.9 % (ref 36.0–46.0)
MCH: 34.6 pg — ABNORMAL HIGH (ref 26.0–34.0)
MCHC: 36.5 g/dL — ABNORMAL HIGH (ref 30.0–36.0)
MCV: 94.9 fL (ref 78.0–100.0)
Platelets: 204 10*3/uL (ref 150–400)
RDW: 12.6 % (ref 11.5–15.5)
WBC: 7.2 10*3/uL (ref 4.0–10.5)

## 2010-12-19 LAB — URINE MICROSCOPIC-ADD ON

## 2010-12-19 LAB — BASIC METABOLIC PANEL
BUN: 13 mg/dL (ref 6–23)
Calcium: 10.3 mg/dL (ref 8.4–10.5)
Chloride: 94 mEq/L — ABNORMAL LOW (ref 96–112)
Creatinine, Ser: 0.8 mg/dL (ref 0.50–1.10)
GFR calc Af Amer: >60 mL/min (ref >60)

## 2010-12-19 LAB — URINALYSIS, ROUTINE W REFLEX MICROSCOPIC
Glucose, UA: NEGATIVE mg/dL
Hgb urine dipstick: NEGATIVE
Ketones, ur: NEGATIVE mg/dL
pH: 7 (ref 5.0–8.0)

## 2010-12-19 LAB — GLUCOSE, CAPILLARY: Glucose-Capillary: 98 mg/dL (ref 70–99)

## 2012-05-03 ENCOUNTER — Other Ambulatory Visit: Payer: Self-pay | Admitting: Internal Medicine

## 2012-05-03 DIAGNOSIS — Z1231 Encounter for screening mammogram for malignant neoplasm of breast: Secondary | ICD-10-CM

## 2012-05-21 ENCOUNTER — Ambulatory Visit: Payer: Medicare Other

## 2012-06-16 ENCOUNTER — Ambulatory Visit
Admission: RE | Admit: 2012-06-16 | Discharge: 2012-06-16 | Disposition: A | Discharge status: Home / Self Care | Payer: Medicare Other | Source: Ambulatory Visit | Attending: Internal Medicine | Admitting: Internal Medicine

## 2012-08-03 ENCOUNTER — Non-Acute Institutional Stay (SKILLED_NURSING_FACILITY): Payer: Medicare Other | Admitting: Adult Health

## 2012-08-03 DIAGNOSIS — R569 Unspecified convulsions: Secondary | ICD-10-CM

## 2012-08-03 DIAGNOSIS — G8929 Other chronic pain: Secondary | ICD-10-CM

## 2012-08-03 DIAGNOSIS — E785 Hyperlipidemia, unspecified: Secondary | ICD-10-CM

## 2012-08-03 DIAGNOSIS — I10 Essential (primary) hypertension: Secondary | ICD-10-CM

## 2012-08-03 DIAGNOSIS — E1149 Type 2 diabetes mellitus with other diabetic neurological complication: Secondary | ICD-10-CM

## 2012-08-03 DIAGNOSIS — R609 Edema, unspecified: Secondary | ICD-10-CM

## 2012-08-03 DIAGNOSIS — K5900 Constipation, unspecified: Secondary | ICD-10-CM

## 2012-08-03 DIAGNOSIS — M109 Gout, unspecified: Secondary | ICD-10-CM

## 2012-09-10 ENCOUNTER — Other Ambulatory Visit: Payer: Self-pay | Admitting: Geriatric Medicine

## 2012-09-10 MED ORDER — PHENOBARBITAL 32.4 MG PO TABS: ORAL_TABLET | ORAL | Status: DC

## 2012-10-05 ENCOUNTER — Non-Acute Institutional Stay (SKILLED_NURSING_FACILITY): Payer: Medicare Other | Admitting: Adult Health

## 2012-10-05 ENCOUNTER — Encounter: Payer: Self-pay | Admitting: Adult Health

## 2012-10-05 DIAGNOSIS — K5900 Constipation, unspecified: Secondary | ICD-10-CM | POA: Insufficient documentation

## 2012-10-05 DIAGNOSIS — I10 Essential (primary) hypertension: Secondary | ICD-10-CM

## 2012-10-05 DIAGNOSIS — R569 Unspecified convulsions: Secondary | ICD-10-CM | POA: Insufficient documentation

## 2012-10-05 DIAGNOSIS — E785 Hyperlipidemia, unspecified: Secondary | ICD-10-CM | POA: Insufficient documentation

## 2012-10-05 DIAGNOSIS — E1149 Type 2 diabetes mellitus with other diabetic neurological complication: Secondary | ICD-10-CM | POA: Insufficient documentation

## 2012-10-05 DIAGNOSIS — F329 Major depressive disorder, single episode, unspecified: Secondary | ICD-10-CM

## 2012-10-05 DIAGNOSIS — G8929 Other chronic pain: Secondary | ICD-10-CM | POA: Insufficient documentation

## 2012-10-05 DIAGNOSIS — M109 Gout, unspecified: Secondary | ICD-10-CM | POA: Insufficient documentation

## 2012-10-05 DIAGNOSIS — R609 Edema, unspecified: Secondary | ICD-10-CM

## 2012-10-05 MED ORDER — MIRTAZAPINE 15 MG PO TABS: 15.0000 mg | ORAL_TABLET | Freq: Every day | ORAL | Status: DC

## 2012-10-05 NOTE — Progress Notes (Signed)
Patient ID: [name], female   DOB: 1940-10-15, 72 y.o.   MRN: [id number]  STARM[facility] Allergies  Allergen Reactions  . Chocolate      Chief Complaint  Patient presents with  . Medical Managment of Chronic Issues    HPI: She is being seen for the management for her chronic illnesses she is voicing no complaints today. She is stable; staff voices no concerns as well.   Past Medical History  Diagnosis Date  . Gout   . Vitamin D deficiency   . Hyperlipidemia   . Hypertension   . Unspecified constipation   . Insomnia   . Chronic pain   . Seizure   . Closed right ankle fracture   . Intellectual disability   . Arthritis   . Anxiety   . Depression   . Anemia     History reviewed. No pertinent past surgical history.  VITAL SIGNS BP 128/70  Pulse 70  Ht 5\' 6"  (1.676 m)  Wt 203 lb (92.08 kg)  BMI 32.78 kg/m2   Patient's Medications  New Prescriptions   No medications on file  Previous Medications   ALLOPURINOL (ZYLOPRIM) 100 MG TABLET    Take 200 mg by mouth daily.   CALCIUM-VITAMIN D (OSCAL WITH D) 500-200 MG-UNIT PER TABLET    Take 1 tablet by mouth 3 (three) times daily.   CARBAMAZEPINE (TEGRETOL XR) 100 MG 12 HR TABLET    Take 300 mg by mouth 2 (two) times daily.   CYCLOBENZAPRINE (FLEXERIL) 5 MG TABLET    Take 5 mg by mouth 2 (two) times daily.   DOCUSATE SODIUM (COLACE) 100 MG CAPSULE    Take 100 mg by mouth daily.   FUROSEMIDE (LASIX) 20 MG TABLET    Take 20 mg by mouth daily.   GABAPENTIN (NEURONTIN) 100 MG CAPSULE    Take 100 mg by mouth 4 (four) times daily.   HYDROCODONE-ACETAMINOPHEN (NORCO/VICODIN) 5-325 MG PER TABLET    Take 1 tablet by mouth daily as needed for pain.   IBUPROFEN (ADVIL,MOTRIN) 400 MG TABLET    Take 400 mg by mouth every 6 (six) hours as needed for pain.   INSULIN GLARGINE (LANTUS) 100 UNIT/ML INJECTION    Inject 10 Units into the skin at bedtime.   LORATADINE (CLARITIN) 10 MG TABLET    Take 10 mg by mouth daily as needed for  allergies.   METFORMIN (GLUCOPHAGE) 1000 MG TABLET    Take 1,000 mg by mouth 2 (two) times daily with a meal.   METOPROLOL SUCCINATE (TOPROL-XL) 25 MG 24 HR TABLET    Take 25 mg by mouth 2 (two) times daily.   PHENOBARBITAL (LUMINAL) 32.4 MG TABLET    Take three tablets by mouth at bedtime.   PRAVASTATIN (PRAVACHOL) 40 MG TABLET    Take 40 mg by mouth daily.   VITAMIN D, ERGOCALCIFEROL, (DRISDOL) 50000 UNITS CAPS    Take 50,000 Units by mouth every 30 (thirty) days.  Modified Medications   No medications on file  Discontinued Medications   No medications on file    SIGNIFICANT DIAGNOSTIC EXAMS   06-02-12: wbc 6.0; hgb 13.3; hct 38.2; mcv 92.9; plt 247; glucose 105; bun 15; creat 0.78; k+ 4.3;  Na++ 137; liver normal albumin 4.1; hgb a1c 6.0 07-06-12: tegretol 7.6; phenobarbital 9.1   Review of Systems  Constitutional: Negative for malaise/fatigue.  Respiratory: Negative for cough and shortness of breath.   Cardiovascular: Negative for chest pain.  Gastrointestinal: Negative for heartburn  and constipation.  Musculoskeletal: Negative for myalgias and joint pain.  Skin: Negative.   Neurological: Negative for headaches.  Psychiatric/Behavioral: Negative for depression. The patient does not have insomnia.     Physical Exam  Constitutional: She appears well-developed and well-nourished.  overweight  Neck: Neck supple. No JVD present. No thyromegaly present.  Cardiovascular: Normal rate, regular rhythm and intact distal pulses.   Respiratory: Effort normal and breath sounds normal. No respiratory distress.  GI: Soft. Bowel sounds are normal. She exhibits no distension.  Musculoskeletal: Normal range of motion. She exhibits no edema.  Neurological: She is alert.  Skin: Skin is warm and dry.      ASSESSMENT/ PLAN:  Type II or unspecified type diabetes mellitus with neurological manifestations, not stated as uncontrolled(250.60) Is stable will continue lantus 10 units daily and  metformin 1 gm twice daily   Gout Will continue allopurinol 200 mg daily   Seizures No seizure activity; will continue phenobarbital 97.2 mg nightly; tegretol er 300 mg twice daily   Essential hypertension, benign Will continue toprol xl 25 mg twice daily   Edema Is stable will continue lasix 20 mg daily and will monitor  Other and unspecified hyperlipidemia Will continue pravachol 40 mg daily   Chronic pain She is voicing no pain in her legs at this time; will continue flexeril 5 mg twice daily for leg cramps; neurontin 100 mg four times daily; vicodin 5/325 mg daily as needed will monitor  Unspecified constipation Will continue colace daily

## 2012-10-05 NOTE — Assessment & Plan Note (Signed)
She is voicing no pain in her legs at this time; will continue flexeril 5 mg twice daily for leg cramps; neurontin 100 mg four times daily; vicodin 5/325 mg daily as needed will monitor

## 2012-10-05 NOTE — Assessment & Plan Note (Signed)
Will continue pravachol 40 mg daily

## 2012-10-05 NOTE — Assessment & Plan Note (Signed)
No seizure activity; will continue phenobarbital 97.2 mg nightly; tegretol er 300 mg twice daily

## 2012-10-05 NOTE — Assessment & Plan Note (Signed)
Will continue colace daily  

## 2012-10-05 NOTE — Progress Notes (Signed)
Patient ID: [name], female   DOB: [birth year], 72 y.o.   MRN: [id number] Patient ID: [name], female   DOB: 1940/04/05, 72 y.o.   MRN: [id number]  [facility] Allergies  Allergen Reactions  . Chocolate      Chief Complaint  Patient presents with  . Medical Managment of Chronic Issues    HPI: She is being seen for the management for her chronic illnesses. She is complaining of increased depression and sadness which has lasted for an extended period of time; she would like for this to be addressed   Past Medical History  Diagnosis Date  . Gout   . Vitamin D deficiency   . Hyperlipidemia   . Hypertension   . Unspecified constipation   . Insomnia   . Chronic pain   . Seizure   . Closed right ankle fracture   . Intellectual disability   . Arthritis   . Anxiety   . Depression   . Anemia     No past surgical history on file.  VITAL SIGNS There were no vitals taken for this visit.   Patient's Medications  New Prescriptions   No medications on file  Previous Medications   ALLOPURINOL (ZYLOPRIM) 100 MG TABLET    Take 200 mg by mouth daily.   CALCIUM-VITAMIN D (OSCAL WITH D) 500-200 MG-UNIT PER TABLET    Take 1 tablet by mouth 3 (three) times daily.   CARBAMAZEPINE (TEGRETOL XR) 100 MG 12 HR TABLET    Take 300 mg by mouth 2 (two) times daily.   CYCLOBENZAPRINE (FLEXERIL) 5 MG TABLET    Take 5 mg by mouth 2 (two) times daily.   DOCUSATE SODIUM (COLACE) 100 MG CAPSULE    Take 100 mg by mouth daily.   FUROSEMIDE (LASIX) 20 MG TABLET    Take 20 mg by mouth daily.   GABAPENTIN (NEURONTIN) 100 MG CAPSULE    Take 100 mg by mouth 4 (four) times daily.   IBUPROFEN (ADVIL,MOTRIN) 400 MG TABLET    Take 400 mg by mouth every 6 (six) hours as needed for pain.   INSULIN GLARGINE (LANTUS) 100 UNIT/ML INJECTION    Inject 10 Units into the skin at bedtime.   LORATADINE (CLARITIN) 10 MG TABLET    Take 10 mg by mouth daily as needed for allergies.   METFORMIN (GLUCOPHAGE) 1000 MG  TABLET    Take 1,000 mg by mouth 2 (two) times daily with a meal.   METOPROLOL SUCCINATE (TOPROL-XL) 25 MG 24 HR TABLET    Take 25 mg by mouth 2 (two) times daily.   PHENOBARBITAL (LUMINAL) 32.4 MG TABLET    Take three tablets by mouth at bedtime.   PRAVASTATIN (PRAVACHOL) 40 MG TABLET    Take 40 mg by mouth daily.   VITAMIN D, ERGOCALCIFEROL, (DRISDOL) 50000 UNITS CAPS    Take 50,000 Units by mouth every 30 (thirty) days.  Modified Medications   No medications on file  Discontinued Medications   HYDROCODONE-ACETAMINOPHEN (NORCO/VICODIN) 5-325 MG PER TABLET    Take 1 tablet by mouth daily as needed for pain.    SIGNIFICANT DIAGNOSTIC EXAMS   06-02-12: wbc 6.0; hgb 13.3; hct 38.2; mcv 92.9; plt 247; glucose 105; bun 15; creat 0.78; k+ 4.3;  Na++ 137; liver normal albumin 4.1; hgb a1c 6.0 07-06-12: tegretol 7.6; phenobarbital 9.1 08-02-12: hgb a1c 5.7 09-02-12: glucose 96; bun 17; creat 0.71; k+ 4.0; na++ 137; liver normal albumin 3.5 Tegretol 7.2; phenobarbital 22.3  Review of Systems  Constitutional: Negative for malaise/fatigue.  Respiratory: Negative for cough and shortness of breath.   Cardiovascular: Negative for chest pain.  Gastrointestinal: Negative for heartburn and constipation.  Musculoskeletal: Negative for myalgias and joint pain.  Skin: Negative.   Neurological: Negative for headaches.  Psychiatric/Behavioral: Positive for depression. The patient does not have insomnia.     Physical Exam  Constitutional: She appears well-developed and well-nourished.  overweight  Neck: Neck supple. No JVD present. No thyromegaly present.  Cardiovascular: Normal rate, regular rhythm and intact distal pulses.   Respiratory: Effort normal and breath sounds normal. No respiratory distress.  GI: Soft. Bowel sounds are normal. She exhibits no distension.  Musculoskeletal: Normal range of motion. She exhibits no edema.  Neurological: She is alert.  Skin: Skin is warm and dry.       ASSESSMENT/ PLAN:   Type II or unspecified type diabetes mellitus with neurological manifestations, not stated as uncontrolled(250.60) Is stable will continue lantus 10 units daily and metformin 1 gm twice daily   Gout Will continue allopurinol 200 mg daily   Seizures No seizure activity; will continue phenobarbital 97.2 mg nightly; tegretol er 300 mg twice daily   Essential hypertension, benign Will continue toprol xl 25 mg twice daily   Edema Is stable will continue lasix 20 mg daily and will monitor  Other and unspecified hyperlipidemia Will continue pravachol 40 mg daily   Chronic pain She is voicing no pain in her legs at this time; will continue flexeril 5 mg twice daily for leg cramps; neurontin 100 mg four times daily; vicodin 5/325 mg daily as needed will monitor  Unspecified constipation Will continue colace daily   Depression Will begin remeron 15 mg nightly for 2 weeks then increase to 30 mg nightly and will monitor her status

## 2012-10-05 NOTE — Assessment & Plan Note (Signed)
Is stable will continue lasix 20 mg daily and will monitor

## 2012-10-05 NOTE — Assessment & Plan Note (Signed)
Will continue toprol xl 25 mg twice daily

## 2012-10-05 NOTE — Assessment & Plan Note (Signed)
Will continue allopurinol 200 mg daily

## 2012-10-05 NOTE — Assessment & Plan Note (Signed)
Is stable will continue lantus 10 units daily and metformin 1 gm twice daily

## 2012-11-24 ENCOUNTER — Non-Acute Institutional Stay (SKILLED_NURSING_FACILITY): Payer: Medicare Other | Admitting: Internal Medicine

## 2012-11-24 DIAGNOSIS — F329 Major depressive disorder, single episode, unspecified: Secondary | ICD-10-CM

## 2012-11-24 DIAGNOSIS — H6123 Impacted cerumen, bilateral: Secondary | ICD-10-CM

## 2012-11-24 DIAGNOSIS — R569 Unspecified convulsions: Secondary | ICD-10-CM

## 2012-11-24 DIAGNOSIS — M109 Gout, unspecified: Secondary | ICD-10-CM

## 2012-11-24 DIAGNOSIS — E785 Hyperlipidemia, unspecified: Secondary | ICD-10-CM

## 2012-11-24 DIAGNOSIS — E1149 Type 2 diabetes mellitus with other diabetic neurological complication: Secondary | ICD-10-CM

## 2012-11-24 DIAGNOSIS — I10 Essential (primary) hypertension: Secondary | ICD-10-CM

## 2012-11-24 DIAGNOSIS — H6120 Impacted cerumen, unspecified ear: Secondary | ICD-10-CM

## 2012-11-24 DIAGNOSIS — F3289 Other specified depressive episodes: Secondary | ICD-10-CM

## 2012-11-24 DIAGNOSIS — G8929 Other chronic pain: Secondary | ICD-10-CM

## 2012-11-24 NOTE — Progress Notes (Signed)
Patient ID: [name], female   DOB: Feb 05, 1941, 72 y.o.   MRN: [id number] Location:  Location:  [facility] Provider:  Gwenith Spitz. Renato Gails, D.O., C.M.D.  Code Status:  DNR  Chief Complaint  Patient presents with  . Medical Managment of Chronic Issues    HPI:  72 yo white female here for long term care with h/o intellectual disability, DMII, hypertension, hyperlipidemia and chronic pain was seen for med mgt of chronic diseases and she c/o left ear pain.  She has told nursing staff about it.  She denies headache, fever, chills, congestion, sore throat, tinnitus or myalgias.  Her finger is getting better.    Review of Systems:  Review of Systems  Constitutional: Negative for fever and chills.  HENT: Positive for ear pain. Negative for hearing loss, congestion, sore throat, tinnitus and ear discharge.   Eyes: Negative for blurred vision.  Respiratory: Negative for shortness of breath.   Cardiovascular: Negative for chest pain.  Gastrointestinal: Negative for abdominal pain.  Genitourinary: Negative for dysuria.  Musculoskeletal: Negative for falls.  Skin: Negative for rash.  Neurological: Negative for dizziness and headaches.  Psychiatric/Behavioral: Negative for memory loss.    Medications: Patient's Medications  New Prescriptions   No medications on file  Previous Medications   ALLOPURINOL (ZYLOPRIM) 100 MG TABLET    Take 200 mg by mouth daily.   CALCIUM-VITAMIN D (OSCAL WITH D) 500-200 MG-UNIT PER TABLET    Take 1 tablet by mouth 3 (three) times daily.   CARBAMAZEPINE (TEGRETOL XR) 100 MG 12 HR TABLET    Take 300 mg by mouth 2 (two) times daily.   CYCLOBENZAPRINE (FLEXERIL) 5 MG TABLET    Take 5 mg by mouth 2 (two) times daily.   DOCUSATE SODIUM (COLACE) 100 MG CAPSULE    Take 100 mg by mouth daily.   FUROSEMIDE (LASIX) 20 MG TABLET    Take 20 mg by mouth daily.   GABAPENTIN (NEURONTIN) 100 MG CAPSULE    Take 100 mg by mouth 4 (four) times daily.   IBUPROFEN  (ADVIL,MOTRIN) 400 MG TABLET    Take 400 mg by mouth every 6 (six) hours as needed for pain.   INSULIN GLARGINE (LANTUS) 100 UNIT/ML INJECTION    Inject 10 Units into the skin at bedtime.   LORATADINE (CLARITIN) 10 MG TABLET    Take 10 mg by mouth daily as needed for allergies.   METFORMIN (GLUCOPHAGE) 1000 MG TABLET    Take 1,000 mg by mouth 2 (two) times daily with a meal.   METOPROLOL SUCCINATE (TOPROL-XL) 25 MG 24 HR TABLET    Take 25 mg by mouth 2 (two) times daily.   MIRTAZAPINE (REMERON) 15 MG TABLET    Take 1 tablet (15 mg total) by mouth at bedtime. Take 15 mg nightly for 2 weeks then increase to 30 mg nightly   PHENOBARBITAL (LUMINAL) 32.4 MG TABLET    Take three tablets by mouth at bedtime.   PRAVASTATIN (PRAVACHOL) 40 MG TABLET    Take 40 mg by mouth daily.   VITAMIN D, ERGOCALCIFEROL, (DRISDOL) 50000 UNITS CAPS    Take 50,000 Units by mouth every 30 (thirty) days.  Modified Medications   No medications on file  Discontinued Medications   No medications on file    Physical Exam: There were no vitals filed for this visit. Physical Exam  Nursing note and vitals reviewed. Constitutional: She appears well-developed and well-nourished. No distress.  HENT:  Head: Normocephalic and  atraumatic.  Right Ear: External ear normal.  Nose: Nose normal.  Mouth/Throat: Oropharynx is clear and moist.  Left ear cerumen visible externally, impacted canal, right also with significant volume of cerumen present though not visible externally  Cardiovascular: Normal rate, regular rhythm, normal heart sounds and intact distal pulses.   Pulmonary/Chest: Effort normal and breath sounds normal. No respiratory distress.  Abdominal: Soft. Bowel sounds are normal. She exhibits no distension.  Musculoskeletal: Normal range of motion.  Neurological: She is alert.  Skin: Skin is warm and dry.   Labs reviewed: 09/01/12:  Na 137, K 4, BUN 17, cr 0.71, alb 3.5, tegretol 7.2, h/h 13.3/38.2, wbc 6, plts 247,  phenobarbital level 22.3  Assessment/Plan 1. Cerumen impaction, bilateral - 5 drops debrox each ear at hs x 3 days, then flush ears with warm water  2. Seizures - cont tegretol and phenobarbital - no seizures in the past few years at least - levels all wnl in June  3. Gout -cont allopurinol -need to check uric acid level coming soon  4. Depressive disorder, not elsewhere classified -mood is good, doing well with remeron  5. Type II or unspecified type diabetes mellitus with neurological manifestations, not stated as uncontrolled(250.60) -controlled with lantus and metformin--renal function good -should have urine microalbumin next draw and hba1c  6. Essential hypertension, benign - at goal with current therapy  7. Other and unspecified hyperlipidemia -check lipids next draw  8. Chronic pain -controlled with flexeril and occasional ibuprofen  Family/ staff Communication: discussed condition with nursing staff  Goals of care: DNR Labs/tests ordered:  Urine microalbumin, hba1c, lipids, uric acid

## 2012-11-26 ENCOUNTER — Encounter: Payer: Self-pay | Admitting: Internal Medicine

## 2012-12-14 ENCOUNTER — Encounter: Payer: Self-pay | Admitting: Internal Medicine

## 2012-12-14 ENCOUNTER — Non-Acute Institutional Stay (SKILLED_NURSING_FACILITY): Payer: Medicare Other | Admitting: Internal Medicine

## 2012-12-14 DIAGNOSIS — R569 Unspecified convulsions: Secondary | ICD-10-CM

## 2012-12-14 DIAGNOSIS — E1149 Type 2 diabetes mellitus with other diabetic neurological complication: Secondary | ICD-10-CM

## 2012-12-14 DIAGNOSIS — K5900 Constipation, unspecified: Secondary | ICD-10-CM

## 2012-12-14 DIAGNOSIS — M109 Gout, unspecified: Secondary | ICD-10-CM

## 2012-12-14 DIAGNOSIS — I10 Essential (primary) hypertension: Secondary | ICD-10-CM

## 2012-12-14 DIAGNOSIS — G8929 Other chronic pain: Secondary | ICD-10-CM

## 2012-12-14 DIAGNOSIS — E785 Hyperlipidemia, unspecified: Secondary | ICD-10-CM

## 2012-12-14 NOTE — Progress Notes (Signed)
MRN: [id number] Name: [name]  Sex: female Age: 72 y.o. DOB: Jul 12, 1940  PSC #:  Facility/Room: Level Of Care: SNF Provider: Merrilee Seashore D Emergency Contacts: Extended Emergency Contact Information Primary Emergency Contact: Dennard Nip [place] Home Phone: [phone] Relation: Sister  Code Status:   Allergies: Chocolate  Chief Complaint  Patient presents with  . Discharge Note    HPI: Patient is 72 y.o. female who is being discharged tomorrow to [facility].  Past Medical History  Diagnosis Date  . Gout   . Vitamin D deficiency   . Hyperlipidemia   . Hypertension   . Unspecified constipation   . Insomnia   . Chronic pain   . Seizure   . Closed right ankle fracture   . Intellectual disability   . Arthritis   . Anxiety   . Depression   . Anemia     History reviewed. No pertinent past surgical history.    Medication List       This list is accurate as of: 12/14/12 11:15 AM.  Always use your most recent med list.               acetaminophen 325 MG tablet  Commonly known as:  TYLENOL  Take 650 mg by mouth every 6 (six) hours as needed for pain.     allopurinol 100 MG tablet  Commonly known as:  ZYLOPRIM  Take 200 mg by mouth daily.     calcium-vitamin D 500-200 MG-UNIT per tablet  Commonly known as:  OSCAL WITH D  Take 1 tablet by mouth 3 (three) times daily.     carbamazepine 100 MG 12 hr tablet  Commonly known as:  TEGRETOL XR  Take 300 mg by mouth 2 (two) times daily.     DECUBI-VITE Caps  Take by mouth.     docusate sodium 100 MG capsule  Commonly known as:  COLACE  Take 100 mg by mouth daily.     furosemide 20 MG tablet  Commonly known as:  LASIX  Take 20 mg by mouth daily.     gabapentin 100 MG capsule  Commonly known as:  NEURONTIN  Take 200 mg by mouth 4 (four) times daily.     HYDROcodone-acetaminophen 5-325 MG per tablet  Commonly known as:  NORCO/VICODIN  Take 1 tablet by mouth  daily as needed for pain.     insulin glargine 100 UNIT/ML injection  Commonly known as:  LANTUS  Inject 10 Units into the skin at bedtime.     loratadine 10 MG tablet  Commonly known as:  CLARITIN  Take 10 mg by mouth daily as needed for allergies.     Menthol-Methyl Salicylate Crea  Apply 1 application topically every 12 (twelve) hours.     metaxalone 800 MG tablet  Commonly known as:  SKELAXIN  Take 800 mg by mouth 3 (three) times daily.     metFORMIN 1000 MG tablet  Commonly known as:  GLUCOPHAGE  Take 1,000 mg by mouth 2 (two) times daily with a meal.     metoprolol succinate 25 MG 24 hr tablet  Commonly known as:  TOPROL-XL  Take 25 mg by mouth daily.     mirtazapine 15 MG tablet  Commonly known as:  REMERON  Take 30 mg by mouth at bedtime.     neomycin-polymyxin-hydrocortisone 3.5-10000-1 otic suspension  Commonly known as:  CORTISPORIN  Place 2 drops into the left ear 4 (four) times daily as needed.  PHENobarbital 32.4 MG tablet  Commonly known as:  LUMINAL  Take three tablets by mouth at bedtime.     pravastatin 40 MG tablet  Commonly known as:  PRAVACHOL  Take 40 mg by mouth daily.     Vitamin D (Ergocalciferol) 50000 UNITS Caps capsule  Commonly known as:  DRISDOL  Take 50,000 Units by mouth every 30 (thirty) days.        Meds ordered this encounter  Medications  . neomycin-polymyxin-hydrocortisone (CORTISPORIN) 3.5-10000-1 otic suspension    Sig: Place 2 drops into the left ear 4 (four) times daily as needed.     There is no immunization history on file for this patient.  History  Substance Use Topics  . Smoking status: Never Smoker   . Smokeless tobacco: Not on file  . Alcohol Use: Not on file       Filed Vitals:   12/14/12 1057  BP: 128/57  Pulse: 60  Temp: 97.8 F (36.6 C)  Resp: 20    Physical Exam  GENERAL APPEARANCE: Alert, Appropriately groomed. No acute distress.  SKIN: No diaphoresis rash HEENT- unremarkable   RESPIRATORY: Breathing is even, unlabored. Lung sounds are clear   CARDIOVASCULAR: Heart RRR no murmurs, rubs or gallops. trace peripheral edema.  GASTROINTESTINAL: Abdomen is soft, non-tender, not distended w/ normal bowel sounds NEUROLOGIC: Oriented X3. Cranial nerves 2-12 grossly intact. Moves all extremities no tremor.  Patient Active Problem List   Diagnosis Date Noted  . Type II or unspecified type diabetes mellitus with neurological manifestations, not stated as uncontrolled(250.60) 10/05/2012  . Gout 10/05/2012  . Seizures 10/05/2012  . Essential hypertension, benign 10/05/2012  . Edema 10/05/2012  . Other and unspecified hyperlipidemia 10/05/2012  . Chronic pain 10/05/2012  . Unspecified constipation 10/05/2012   CBC  05/2012  WBC 6.0  13.3/38.2   MCV  92.9  PLT 247  CMP 08/2012  137, 4.0, 96, 29, 96, 17/0.71  LFT and proteins normal HbA1c 07/2012   5.7 Phenobarb level  08/2012  22.3   Assessment and Plan  Pt is being d/c today in stable problems. Of note pt's metoprolol XL 25 mg is being changed to daily.   Margit Hanks, MD

## 2014-09-20 ENCOUNTER — Other Ambulatory Visit: Payer: Self-pay | Admitting: Gastroenterology

## 2014-09-20 DIAGNOSIS — R1310 Dysphagia, unspecified: Secondary | ICD-10-CM

## 2014-09-22 ENCOUNTER — Ambulatory Visit
Admission: RE | Admit: 2014-09-22 | Discharge: 2014-09-22 | Disposition: A | Discharge status: Home / Self Care | Payer: Medicare Other | Source: Ambulatory Visit | Attending: Gastroenterology | Admitting: Gastroenterology

## 2014-09-22 DIAGNOSIS — R1310 Dysphagia, unspecified: Secondary | ICD-10-CM

## 2014-09-25 ENCOUNTER — Other Ambulatory Visit (HOSPITAL_COMMUNITY): Payer: Self-pay | Admitting: Gastroenterology

## 2014-09-25 DIAGNOSIS — R1314 Dysphagia, pharyngoesophageal phase: Secondary | ICD-10-CM

## 2014-10-03 ENCOUNTER — Ambulatory Visit (HOSPITAL_COMMUNITY)
Admission: RE | Admit: 2014-10-03 | Discharge: 2014-10-03 | Disposition: A | Discharge status: Home / Self Care | Payer: Medicare Other | Source: Ambulatory Visit | Attending: Gastroenterology | Admitting: Gastroenterology

## 2014-10-03 DIAGNOSIS — R1310 Dysphagia, unspecified: Secondary | ICD-10-CM | POA: Diagnosis present

## 2014-10-03 DIAGNOSIS — R05 Cough: Secondary | ICD-10-CM | POA: Diagnosis present

## 2014-10-03 DIAGNOSIS — R1314 Dysphagia, pharyngoesophageal phase: Secondary | ICD-10-CM

## 2017-01-28 ENCOUNTER — Ambulatory Visit: Payer: Self-pay | Admitting: Podiatry

## 2017-02-13 ENCOUNTER — Ambulatory Visit (INDEPENDENT_AMBULATORY_CARE_PROVIDER_SITE_OTHER): Payer: Medicare Other | Admitting: Podiatry

## 2017-02-13 ENCOUNTER — Encounter: Payer: Self-pay | Admitting: Podiatry

## 2017-02-13 VITALS — BP 120/62 | HR 72 | Resp 16

## 2017-02-13 DIAGNOSIS — M2042 Other hammer toe(s) (acquired), left foot: Secondary | ICD-10-CM

## 2017-02-13 DIAGNOSIS — M21619 Bunion of unspecified foot: Secondary | ICD-10-CM

## 2017-02-13 DIAGNOSIS — M2010 Hallux valgus (acquired), unspecified foot: Secondary | ICD-10-CM

## 2017-02-13 DIAGNOSIS — E1151 Type 2 diabetes mellitus with diabetic peripheral angiopathy without gangrene: Secondary | ICD-10-CM | POA: Diagnosis not present

## 2017-02-13 DIAGNOSIS — M2041 Other hammer toe(s) (acquired), right foot: Secondary | ICD-10-CM

## 2017-02-13 DIAGNOSIS — E119 Type 2 diabetes mellitus without complications: Secondary | ICD-10-CM | POA: Diagnosis not present

## 2017-02-13 DIAGNOSIS — E1142 Type 2 diabetes mellitus with diabetic polyneuropathy: Secondary | ICD-10-CM | POA: Diagnosis not present

## 2017-02-13 NOTE — Progress Notes (Signed)
Subjective:  Patient ID: [name], female    DOB: 05/20/1940,  MRN: [id number]  Chief Complaint  Patient presents with  . Diabetes    Requesting diabetic shoe and inserts - "My feet don't hurt"    76 y.o. female presents with the above complaint. Denies foot pain.  Requesting diabetic shoes and inserts.  Past Medical History:  Diagnosis Date  . Anemia   . Anxiety   . Arthritis   . Chronic pain   . Closed right ankle fracture   . Depression   . Gout   . Hyperlipidemia   . Hypertension   . Insomnia   . Intellectual disability   . Seizure (HCC)   . Unspecified constipation   . Vitamin D deficiency    No past surgical history on file.  Current Outpatient Medications:  .  acetaminophen (TYLENOL) 325 MG tablet, Take 650 mg by mouth every 6 (six) hours as needed for pain., Disp: , Rfl:  .  allopurinol (ZYLOPRIM) 100 MG tablet, Take 200 mg by mouth daily., Disp: , Rfl:  .  amLODipine (NORVASC) 2.5 MG tablet, Take 2.5 mg by mouth., Disp: , Rfl:  .  aspirin 81 MG chewable tablet, Chew 81 mg by mouth., Disp: , Rfl:  .  atorvastatin (LIPITOR) 20 MG tablet, Take 20 mg by mouth., Disp: , Rfl:  .  Calcium Carb-Cholecalciferol (CALCIUM-VITAMIN D) 500-200 MG-UNIT tablet, Take by mouth., Disp: , Rfl:  .  calcium-vitamin D (OSCAL WITH D) 500-200 MG-UNIT per tablet, Take 1 tablet by mouth 3 (three) times daily., Disp: , Rfl:  .  carbamazepine (TEGRETOL XR) 100 MG 12 hr tablet, Take 300 mg by mouth 2 (two) times daily., Disp: , Rfl:  .  Cholecalciferol (VITAMIN D PO), Take by mouth., Disp: , Rfl:  .  ciprofloxacin-hydrocortisone (CIPRO HC OTIC) OTIC suspension, 3 drops 2 times daily., Disp: , Rfl:  .  Cranberry 450 MG TABS, Take 450 mg by mouth., Disp: , Rfl:  .  docusate sodium (COLACE) 100 MG capsule, Take 100 mg by mouth daily., Disp: , Rfl:  .  fluticasone (FLONASE) 50 MCG/ACT nasal spray, Place into the nose., Disp: , Rfl:  .  fluticasone furoate-vilanterol (BREO ELLIPTA) 200-25  MCG/INH AEPB, Inhale into the lungs., Disp: , Rfl:  .  furosemide (LASIX) 20 MG tablet, Take 20 mg by mouth daily., Disp: , Rfl:  .  gabapentin (NEURONTIN) 100 MG capsule, Take 200 mg by mouth 4 (four) times daily. , Disp: , Rfl:  .  HYDROcodone-acetaminophen (NORCO/VICODIN) 5-325 MG per tablet, Take 1 tablet by mouth daily as needed for pain., Disp: , Rfl:  .  insulin glargine (LANTUS) 100 UNIT/ML injection, Inject 10 Units into the skin at bedtime., Disp: , Rfl:  .  loratadine (CLARITIN) 10 MG tablet, Take 10 mg by mouth daily as needed for allergies., Disp: , Rfl:  .  losartan (COZAAR) 25 MG tablet, Take 25 mg by mouth., Disp: , Rfl:  .  Melatonin 1 MG TABS, Take 1 mg by mouth., Disp: , Rfl:  .  Menthol-Methyl Salicylate CREA, Apply 1 application topically every 12 (twelve) hours., Disp: , Rfl:  .  metaxalone (SKELAXIN) 800 MG tablet, Take 800 mg by mouth 3 (three) times daily., Disp: , Rfl:  .  metFORMIN (GLUCOPHAGE) 1000 MG tablet, Take 1,000 mg by mouth 2 (two) times daily with a meal., Disp: , Rfl:  .  metoprolol succinate (TOPROL-XL) 25 MG 24 hr tablet, Take 25 mg by mouth  daily. , Disp: , Rfl:  .  mirtazapine (REMERON) 15 MG tablet, Take 30 mg by mouth at bedtime., Disp: , Rfl:  .  Multiple Vitamins-Minerals (DECUBI-VITE) CAPS, Take by mouth., Disp: , Rfl:  .  neomycin-polymyxin-hydrocortisone (CORTISPORIN) 3.5-10000-1 otic suspension, Place 2 drops into the left ear 4 (four) times daily as needed., Disp: , Rfl:  .  PHENobarbital (LUMINAL) 32.4 MG tablet, Take three tablets by mouth at bedtime., Disp: 90 tablet, Rfl: 5 .  pravastatin (PRAVACHOL) 40 MG tablet, Take 40 mg by mouth daily., Disp: , Rfl:  .  Vitamin D, Ergocalciferol, (DRISDOL) 50000 UNITS CAPS, Take 50,000 Units by mouth every 30 (thirty) days., Disp: , Rfl:   Allergies  Allergen Reactions  . Chocolate    Review of Systems  Constitutional: Positive for activity change and fatigue.  HENT: Positive for hearing loss.     Musculoskeletal: Positive for arthralgias and gait problem.  Neurological: Positive for speech difficulty and weakness.   Objective:   Vitals:   02/13/17 1358  BP: 120/62  Pulse: 72  Resp: 16   General AA&O x3. Normal mood and affect.  Vascular Dorsalis pedis and posterior tibial pulses  present 1+ and absent bilaterally  Capillary refill normal to all digits. Pedal hair growth diminished.  Neurologic Epicritic sensation grossly absent.  Dermatologic No open lesions. Interspaces clear of maceration. Nails well groomed and normal in appearance.  Orthopedic: MMT 5/5 in dorsiflexion, plantarflexion, inversion, and eversion. Normal joint ROM without pain or crepitus. HAV bilat with hammertoes.   Assessment & Plan:  Patient was evaluated and treated and all questions answered.  DM with DPN -Would benefit from DM shoes. -Will schedule appt for casting -Follow up PRN. -  No Follow-up on file.

## 2017-03-04 ENCOUNTER — Other Ambulatory Visit: Payer: Medicare Other

## 2017-04-01 ENCOUNTER — Ambulatory Visit (INDEPENDENT_AMBULATORY_CARE_PROVIDER_SITE_OTHER): Payer: Medicare Other | Admitting: Orthotics

## 2017-04-01 DIAGNOSIS — E1142 Type 2 diabetes mellitus with diabetic polyneuropathy: Secondary | ICD-10-CM

## 2017-04-01 DIAGNOSIS — M21619 Bunion of unspecified foot: Secondary | ICD-10-CM | POA: Diagnosis not present

## 2017-04-01 DIAGNOSIS — M2010 Hallux valgus (acquired), unspecified foot: Secondary | ICD-10-CM | POA: Diagnosis not present

## 2017-04-01 DIAGNOSIS — E1151 Type 2 diabetes mellitus with diabetic peripheral angiopathy without gangrene: Secondary | ICD-10-CM | POA: Diagnosis not present

## 2019-10-05 ENCOUNTER — Other Ambulatory Visit (HOSPITAL_COMMUNITY): Payer: Self-pay | Admitting: Internal Medicine

## 2019-10-05 ENCOUNTER — Other Ambulatory Visit: Payer: Self-pay | Admitting: Internal Medicine

## 2019-10-05 DIAGNOSIS — R918 Other nonspecific abnormal finding of lung field: Secondary | ICD-10-CM

## 2019-10-12 ENCOUNTER — Other Ambulatory Visit (HOSPITAL_COMMUNITY): Payer: Self-pay | Admitting: Internal Medicine

## 2019-10-12 ENCOUNTER — Other Ambulatory Visit: Payer: Self-pay

## 2019-10-12 ENCOUNTER — Ambulatory Visit (HOSPITAL_COMMUNITY)
Admission: RE | Admit: 2019-10-12 | Discharge: 2019-10-12 | Disposition: A | Discharge status: Home / Self Care | Payer: Medicare Other | Source: Ambulatory Visit | Attending: Internal Medicine | Admitting: Internal Medicine

## 2019-10-12 DIAGNOSIS — R918 Other nonspecific abnormal finding of lung field: Secondary | ICD-10-CM

## 2019-10-14 ENCOUNTER — Other Ambulatory Visit: Payer: Self-pay | Admitting: Geriatric Medicine

## 2019-10-14 ENCOUNTER — Other Ambulatory Visit (HOSPITAL_COMMUNITY): Payer: Self-pay | Admitting: Geriatric Medicine

## 2019-10-14 DIAGNOSIS — J180 Bronchopneumonia, unspecified organism: Secondary | ICD-10-CM

## 2019-10-17 ENCOUNTER — Other Ambulatory Visit: Payer: Self-pay | Admitting: Internal Medicine

## 2019-12-02 ENCOUNTER — Other Ambulatory Visit: Payer: Self-pay

## 2019-12-02 ENCOUNTER — Ambulatory Visit (HOSPITAL_COMMUNITY)
Admission: RE | Admit: 2019-12-02 | Discharge: 2019-12-02 | Disposition: A | Discharge status: Home / Self Care | Payer: Medicare Other | Source: Ambulatory Visit | Attending: Geriatric Medicine | Admitting: Geriatric Medicine

## 2019-12-02 DIAGNOSIS — J180 Bronchopneumonia, unspecified organism: Secondary | ICD-10-CM | POA: Diagnosis present

## 2019-12-02 MED ORDER — IOHEXOL 300 MG/ML  SOLN
75.0000 mL | Freq: Once | INTRAMUSCULAR | Status: AC | PRN
  Administered 2019-12-02: 75 mL via INTRAVENOUS

## 2020-12-17 ENCOUNTER — Ambulatory Visit: Payer: Medicare Other | Admitting: Obstetrics and Gynecology

## 2021-11-12 ENCOUNTER — Other Ambulatory Visit: Payer: Self-pay

## 2021-11-12 ENCOUNTER — Emergency Department (HOSPITAL_COMMUNITY)
Admission: EM | Admit: 2021-11-12 | Discharge: 2021-11-12 | Disposition: A | Discharge status: Skilled Nursing Facility | Payer: Medicare Other | Attending: Emergency Medicine | Admitting: Emergency Medicine

## 2021-11-12 ENCOUNTER — Encounter (HOSPITAL_COMMUNITY): Payer: Self-pay

## 2021-11-12 ENCOUNTER — Emergency Department (HOSPITAL_COMMUNITY): Payer: Medicare Other

## 2021-11-12 DIAGNOSIS — S0003XA Contusion of scalp, initial encounter: Secondary | ICD-10-CM | POA: Insufficient documentation

## 2021-11-12 DIAGNOSIS — S61213A Laceration without foreign body of left middle finger without damage to nail, initial encounter: Secondary | ICD-10-CM | POA: Diagnosis not present

## 2021-11-12 DIAGNOSIS — Y92121 Bathroom in nursing home as the place of occurrence of the external cause: Secondary | ICD-10-CM | POA: Diagnosis not present

## 2021-11-12 DIAGNOSIS — M25522 Pain in left elbow: Secondary | ICD-10-CM | POA: Diagnosis present

## 2021-11-12 DIAGNOSIS — I4891 Unspecified atrial fibrillation: Secondary | ICD-10-CM

## 2021-11-12 DIAGNOSIS — R55 Syncope and collapse: Secondary | ICD-10-CM | POA: Insufficient documentation

## 2021-11-12 DIAGNOSIS — S51012A Laceration without foreign body of left elbow, initial encounter: Secondary | ICD-10-CM | POA: Insufficient documentation

## 2021-11-12 DIAGNOSIS — S80212A Abrasion, left knee, initial encounter: Secondary | ICD-10-CM | POA: Diagnosis not present

## 2021-11-12 DIAGNOSIS — Z7982 Long term (current) use of aspirin: Secondary | ICD-10-CM | POA: Diagnosis not present

## 2021-11-12 DIAGNOSIS — T07XXXA Unspecified multiple injuries, initial encounter: Secondary | ICD-10-CM

## 2021-11-12 DIAGNOSIS — W1830XA Fall on same level, unspecified, initial encounter: Secondary | ICD-10-CM | POA: Insufficient documentation

## 2021-11-12 LAB — CBC WITH DIFFERENTIAL/PLATELET
Abs Immature Granulocytes: 0.02 10*3/uL (ref 0.00–0.07)
Basophils Absolute: 0 10*3/uL (ref 0.0–0.1)
Basophils Relative: 1 %
Eosinophils Absolute: 0.1 10*3/uL (ref 0.0–0.5)
Eosinophils Relative: 2 %
HCT: 33.9 % — ABNORMAL LOW (ref 36.0–46.0)
Hemoglobin: 10.5 g/dL — ABNORMAL LOW (ref 12.0–15.0)
Immature Granulocytes: 0 %
Lymphocytes Relative: 20 %
Lymphs Abs: 1.6 10*3/uL (ref 0.7–4.0)
MCH: 30.2 pg (ref 26.0–34.0)
MCHC: 31 g/dL (ref 30.0–36.0)
MCV: 97.4 fL (ref 80.0–100.0)
Monocytes Absolute: 0.5 10*3/uL (ref 0.1–1.0)
Monocytes Relative: 6 %
Neutro Abs: 5.7 10*3/uL (ref 1.7–7.7)
Neutrophils Relative %: 71 %
Platelets: 211 10*3/uL (ref 150–400)
RBC: 3.48 MIL/uL — ABNORMAL LOW (ref 3.87–5.11)
RDW: 16.1 % — ABNORMAL HIGH (ref 11.5–15.5)
WBC: 7.9 10*3/uL (ref 4.0–10.5)
nRBC: 0 % (ref 0.0–0.2)

## 2021-11-12 LAB — CBG MONITORING, ED: Glucose-Capillary: 161 mg/dL — ABNORMAL HIGH (ref 70–99)

## 2021-11-12 LAB — BASIC METABOLIC PANEL
Anion gap: 7 (ref 5–15)
BUN: 23 mg/dL (ref 8–23)
CO2: 30 mmol/L (ref 22–32)
Calcium: 8.9 mg/dL (ref 8.9–10.3)
Chloride: 103 mmol/L (ref 98–111)
Creatinine, Ser: 0.88 mg/dL (ref 0.44–1.00)
GFR, Estimated: >60 mL/min (ref >60)
Glucose, Bld: 179 mg/dL — ABNORMAL HIGH (ref 70–99)
Potassium: 4 mmol/L (ref 3.5–5.1)
Sodium: 140 mmol/L (ref 135–145)

## 2021-11-12 LAB — TROPONIN I (HIGH SENSITIVITY): Troponin I (High Sensitivity): 13 ng/L (ref <18)

## 2021-11-12 MED ORDER — LIDOCAINE HCL (PF) 1 % IJ SOLN
5.0000 mL | Freq: Once | INTRAMUSCULAR | Status: DC
  Filled 2021-11-12: qty 30

## 2021-11-12 MED ORDER — ASPIRIN 325 MG PO TBEC: 325.0000 mg | DELAYED_RELEASE_TABLET | Freq: Every day | ORAL | 0 refills | Status: DC

## 2021-11-12 NOTE — ED Triage Notes (Signed)
Pt. BIB [facility] for a unwitnessed fall that happened at about 2330. Pt. Has abrasions to the knee, to the elbow, and injury to the L. Hand. Pt. Is A&Ox2 at baseline. Pt. Does not remember falling. States that she "passed out." Per EMS pt. Has a hx of seizure but was not postictal. Pt. Not taking blood thinners.   EMS VS: BP:150/80 HR: 88 O2: 94% on 2L via [place]

## 2021-11-12 NOTE — ED Notes (Signed)
[facility] called for pt. transportation ?

## 2021-11-12 NOTE — Discharge Instructions (Addendum)
WOUND CARE Please have your stitches/staples removed in 7 days or sooner if you have concerns. You may do this at any available urgent care or at your primary care doctor's office.  Keep area clean and dry for 24 hours. Do not remove bandage, if applied.  After 24 hours, remove bandage and wash wound gently with mild soap and warm water. Reapply a new bandage after cleaning wound, if directed.  Continue daily cleansing with soap and water until stitches/staples are removed.  Do not apply any ointments or creams to the wound while stitches/staples are in place, as this may cause delayed healing.  Seek medical careif you experience any of the following signs of infection: Swelling, redness, pus drainage, streaking, fever >101.0 F  Seek care if you experience excessive bleeding that does not stop after 15-20 minutes of constant, firm pressure.       Get help right away if you have:  Chest pain, abdominal pain, sweating, or weakness. Trouble breathing. Side effects of blood thinners, such as blood in your vomit, stool, or urine, or bleeding that cannot stop. Any symptoms of a stroke. "BE FAST" is an easy way to remember the main warning signs of a stroke: B - Balance. Signs are dizziness, sudden trouble walking, or loss of balance. E - Eyes. Signs are trouble seeing or a sudden change in vision. F - Face. Signs are sudden weakness or numbness of the face, or the face or eyelid drooping on one side. A - Arms. Signs are weakness or numbness in an arm. This happens suddenly and usually on one side of the body. S - Speech. Signs are sudden trouble speaking, slurred speech, or trouble understanding what people say. T - Time. Time to call emergency services. Write down what time symptoms started. Other signs of a stroke, such as: A sudden, severe headache with no known cause. Nausea or vomiting. Seizure.

## 2021-11-12 NOTE — ED Notes (Signed)
Provider did not want a repeat of troponin levels

## 2021-11-12 NOTE — ED Provider Notes (Signed)
[facility] DEPT Provider Note   CSN: [id number] Arrival date & time: 11/12/21  0044     History  Chief Complaint  Patient presents with   [name] is a 81 y.o. female who presents emergency department for seizure versus syncope.  She has a history of both seizure and syncope.  She is currently at [facility].  Apparently she was using the bathroom and had shaking and then fell forward onto the ground.  Cording to EMS the patient did not have a postictal phase and may have had myoclonic jerking just prior to loss of consciousness.  She complains of pain in her left hand, elbow and left knee.  She denies headache but does have noticeable bruising to the left temporal region.  She denies chest pain, shortness of breath or weakness.  up-to-date on her tetanus shot   Fall       Home Medications Prior to Admission medications   Medication Sig Start Date End Date Taking? Authorizing Provider  acetaminophen (TYLENOL) 325 MG tablet Take 650 mg by mouth every 6 (six) hours as needed for pain.    [provider]  allopurinol (ZYLOPRIM) 100 MG tablet Take 200 mg by mouth daily.    [provider]  allopurinol (ZYLOPRIM) 300 MG tablet Take 300 mg by mouth daily. 11/08/21   [provider]  amLODipine (NORVASC) 2.5 MG tablet Take 2.5 mg by mouth.    [provider]  aspirin 81 MG chewable tablet Chew 81 mg by mouth.    [provider]  atorvastatin (LIPITOR) 20 MG tablet Take 20 mg by mouth.    [provider]  Calcium Carb-Cholecalciferol (CALCIUM-VITAMIN D) 500-200 MG-UNIT tablet Take by mouth.    [provider]  calcium-vitamin D (OSCAL WITH D) 500-200 MG-UNIT per tablet Take 1 tablet by mouth 3 (three) times daily.    [provider]  carbamazepine (TEGRETOL XR) 100 MG 12 hr tablet Take 300 mg by mouth 2 (two) times daily.    [provider]   Cholecalciferol (VITAMIN D PO) Take by mouth.    [provider]  ciprofloxacin-hydrocortisone (CIPRO HC OTIC) OTIC suspension 3 drops 2 times daily.    [provider]  clindamycin (CLEOCIN) 150 MG capsule Take by mouth. 10/08/21   [provider]  Cranberry 450 MG TABS Take 450 mg by mouth.    [provider]  docusate sodium (COLACE) 100 MG capsule Take 100 mg by mouth daily.    [provider]  fluticasone (FLONASE) 50 MCG/ACT nasal spray Place into the nose.    [provider]  fluticasone furoate-vilanterol (BREO ELLIPTA) 200-25 MCG/INH AEPB Inhale into the lungs.    [provider]  furosemide (LASIX) 20 MG tablet Take 20 mg by mouth daily.    [provider]  gabapentin (NEURONTIN) 100 MG capsule Take 200 mg by mouth 4 (four) times daily.     [provider]  HYDROcodone-acetaminophen (NORCO/VICODIN) 5-325 MG per tablet Take 1 tablet by mouth daily as needed for pain.    [provider]  insulin glargine (LANTUS) 100 UNIT/ML injection Inject 10 Units into the skin at bedtime.    [provider]  loratadine (CLARITIN) 10 MG tablet Take 10 mg by mouth daily as needed for allergies.    [provider]  losartan (COZAAR) 25 MG tablet Take 25 mg by mouth.    [provider]  Melatonin 1 MG TABS Take 1 mg by mouth.    [provider]  Menthol-Methyl Salicylate CREA Apply 1 application topically every 12 (twelve) hours.    [provider]  metaxalone (SKELAXIN) 800 MG tablet Take 800 mg by mouth 3 (three) times daily.    [provider]  metFORMIN (GLUCOPHAGE) 1000 MG tablet Take 1,000 mg by mouth 2 (two) times daily with a meal.    [provider]  metoprolol succinate (TOPROL-XL) 25 MG 24 hr tablet Take 25 mg by mouth daily.     [provider]  mirtazapine (REMERON) 15 MG tablet Take 30 mg by mouth at bedtime. 10/05/12   Gerlene Fee, NP  Multiple Vitamins-Minerals (DECUBI-VITE) CAPS Take by mouth.    [provider]  neomycin-polymyxin-hydrocortisone (CORTISPORIN) 3.5-10000-1 otic suspension Place 2 drops into the left ear 4 (four) times daily as needed.    [provider]  pantoprazole (PROTONIX) 40 MG tablet Take 40 mg by mouth daily. 10/24/21   [provider]  PHENobarbital (LUMINAL) 32.4 MG tablet Take three tablets by mouth at bedtime. 09/10/12   Lauree Chandler, NP  pravastatin (PRAVACHOL) 40 MG tablet Take 40 mg by mouth daily.    [provider]  sertraline (ZOLOFT) 50 MG tablet Take 50 mg by mouth 2 (two) times daily. 11/07/21   [provider]  sildenafil (REVATIO) 20 MG tablet Take 20 mg by mouth 3 (three) times daily. 11/05/21   [provider]  spironolactone (ALDACTONE) 50 MG tablet Take 50 mg by mouth daily. 11/05/21   [provider]  traMADol (ULTRAM) 50 MG tablet Take 50 mg by mouth every 6 (six) hours as needed. 10/15/21   [provider]  Vitamin D, Ergocalciferol, (DRISDOL) 50000 UNITS CAPS Take 50,000 Units by mouth every 30 (thirty) days.    [provider]      Allergies    Chocolate    Review of Systems   Review of Systems  Physical Exam Updated Vital Signs BP (!) 132/56   Pulse 79   Temp 97.7 F (36.5 C) (Oral)   Resp 13   SpO2 100%  Physical Exam Vitals and nursing note reviewed.  Constitutional:      General: She is not in acute distress.    Appearance: She is well-developed. She is not diaphoretic.  HENT:     Head: Normocephalic.     Comments: Bruising to the left temporal region    Right Ear: External ear normal.     Left Ear: External ear normal.     Nose: Nose normal.     Mouth/Throat:     Mouth: Mucous membranes are moist.  Eyes:     General: No scleral icterus.    Conjunctiva/sclera: Conjunctivae normal.  Cardiovascular:     Rate and Rhythm: Normal rate and regular rhythm.     Heart  sounds: Normal heart sounds. No murmur heard.    No friction rub. No gallop.  Pulmonary:     Effort: Pulmonary effort is normal. No respiratory distress.     Breath sounds: Normal breath sounds.  Abdominal:     General: Bowel sounds are normal. There is no distension.     Palpations: Abdomen is soft. There is no mass.     Tenderness: There is no abdominal tenderness. There is no guarding.  Musculoskeletal:     Cervical back: Normal range of motion.     Comments: Laceration to the left middle finger,  normal range of motion of fingers.  Skin tear noted to the left elbow, full range with the left elbow, abrasion to the left knee with bleeding, full range of motion to left knee with pain.  Skin:    General: Skin is warm and dry.  Neurological:     General: No focal deficit present.     Mental Status: She is alert and oriented to person, place, and time. Mental status is at baseline.     Cranial Nerves: No cranial nerve deficit.     Sensory: No sensory deficit.     Motor: No weakness.     Coordination: Coordination normal.     Deep Tendon Reflexes: Reflexes normal.  Psychiatric:        Behavior: Behavior normal.     ED Results / Procedures / Treatments   Labs (all labs ordered are listed, but only abnormal results are displayed) Labs Reviewed  BASIC METABOLIC PANEL - Abnormal; Notable for the following components:      Result Value   Glucose, Bld 179 (*)    All other components within normal limits  CBC WITH DIFFERENTIAL/PLATELET - Abnormal; Notable for the following components:   RBC 3.48 (*)    Hemoglobin 10.5 (*)    HCT 33.9 (*)    RDW 16.1 (*)    All other components within normal limits  CBG MONITORING, ED - Abnormal; Notable for the following components:   Glucose-Capillary 161 (*)    All other components within normal limits  TROPONIN I (HIGH SENSITIVITY)  TROPONIN I (HIGH SENSITIVITY)    EKG None  Radiology CT Cervical Spine Wo Contrast  Result Date:  11/12/2021 CLINICAL DATA:  Status post fall. EXAM: CT CERVICAL SPINE WITHOUT CONTRAST TECHNIQUE: Multidetector CT imaging of the cervical spine was performed without intravenous contrast. Multiplanar CT image reconstructions were also generated. RADIATION DOSE REDUCTION: This exam was performed according to the departmental dose-optimization program which includes automated exposure control, adjustment of the mA and/or kV according to patient size and/or use of iterative reconstruction technique. COMPARISON:  None Available. FINDINGS: Alignment: Normal. Skull base and vertebrae: No acute fracture. No primary bone lesion or focal pathologic process. Soft tissues and spinal canal: No prevertebral fluid or swelling. No visible canal hematoma. Disc levels: Marked severity endplate sclerosis and moderate severity anterior osteophyte formation are seen at the levels of C4-C5, C5-C6, C6-C7 and C7-T1. There is marked severity narrowing of the anterior atlantoaxial articulation. Marked severity intervertebral disc space narrowing is seen at C5-C6, C6-C7 and C7-T1. Moderate severity intervertebral disc space narrowing is noted at C4-C5. Bilateral moderate to marked severity multilevel facet joint hypertrophy is noted. Upper chest: Negative. Other: None. IMPRESSION: Marked severity multilevel degenerative changes without evidence of an acute fracture or subluxation. Electronically Signed   By: Aram Candela M.D.   On: 11/12/2021 04:12   CT Head Wo Contrast  Result Date: 11/12/2021 CLINICAL DATA:  Status post fall. EXAM: CT HEAD WITHOUT CONTRAST TECHNIQUE: Contiguous axial images were obtained from the base of the skull through the vertex without intravenous contrast. RADIATION DOSE REDUCTION: This exam was performed according to the departmental dose-optimization program which includes automated exposure control, adjustment of the mA and/or kV according to patient size and/or use of iterative reconstruction technique.  COMPARISON:  February 23, 2006 FINDINGS: Brain: There is mild cerebral atrophy with widening of the extra-axial spaces and ventricular dilatation. There are areas of decreased attenuation within the white matter tracts of the supratentorial brain, consistent with  microvascular disease changes. Vascular: No hyperdense vessel or unexpected calcification. Skull: Normal. Negative for fracture or focal lesion. Sinuses/Orbits: A 1.7 cm x 1.0 cm right maxillary sinus polyp versus mucous retention cyst is seen. Other: None. IMPRESSION: No acute intracranial abnormality. Electronically Signed   By: Aram Candela M.D.   On: 11/12/2021 04:09   DG Knee Complete 4 Views Left  Result Date: 11/12/2021 CLINICAL DATA:  Recent fall with left knee pain, initial encounter EXAM: LEFT KNEE - COMPLETE 4+ VIEW COMPARISON:  None Available. FINDINGS: Mild soft tissue swelling is noted anteriorly related to the recent injury. No acute fracture or dislocation is seen. IMPRESSION: Soft tissue swelling without acute bony abnormality. Electronically Signed   By: Alcide Clever M.D.   On: 11/12/2021 03:49   DG Elbow Complete Left  Result Date: 11/12/2021 CLINICAL DATA:  Fall earlier today with left elbow pain, initial encounter EXAM: LEFT ELBOW - COMPLETE 3+ VIEW COMPARISON:  None Available. FINDINGS: Small avulsion is noted from the radial head with mild joint effusion and elevation of the anterior fat pad. No other fracture is seen. Mild spurring is noted from the olecranon. IMPRESSION: Mild avulsion from the radial head with small joint effusion anteriorly. Electronically Signed   By: Alcide Clever M.D.   On: 11/12/2021 03:48   DG Hand Complete Left  Result Date: 11/12/2021 CLINICAL DATA:  Recent fall with hand pain and swelling, initial encounter EXAM: LEFT HAND - COMPLETE 3+ VIEW COMPARISON:  None Available. FINDINGS: Considerable degenerative change at the first [facility] joint is seen. Some dystrophic soft tissue calcifications are  noted adjacent to the joint. No acute fracture or dislocation is noted. Interphalangeal degenerative changes are noted. No acute soft tissue abnormality is seen. IMPRESSION: Degenerative changes without acute abnormality. Electronically Signed   By: Alcide Clever M.D.   On: 11/12/2021 03:47    Procedures .Marland KitchenLaceration Repair  Date/Time: 11/12/2021 5:24 AM  Performed by: Arthor Captain, PA-C Authorized by: Arthor Captain, PA-C   Consent:    Consent obtained:  Verbal   Consent given by:  Patient   Risks discussed:  Infection, need for additional repair, pain, poor cosmetic result and poor wound healing   Alternatives discussed:  No treatment and delayed treatment Universal protocol:    Procedure explained and questions answered to patient or proxy's satisfaction: yes     Relevant documents present and verified: yes     Test results available: yes     Imaging studies available: yes     Required blood products, implants, devices, and special equipment available: yes     Site/side marked: yes     Immediately prior to procedure, a time out was called: yes     Patient identity confirmed:  Verbally with patient Anesthesia:    Anesthesia method:  Local infiltration   Local anesthetic:  Lidocaine 1% w/o epi Laceration details:    Location:  Finger   Finger location:  L long finger   Length (cm):  3 Pre-procedure details:    Preparation:  Patient was prepped and draped in usual sterile fashion Exploration:    Wound exploration: wound explored through full range of motion and entire depth of wound visualized   Treatment:    Area cleansed with:  Povidone-iodine   Irrigation solution:  Sterile saline   Irrigation method:  Syringe Skin repair:    Repair method:  Sutures   Suture size:  5-0   Suture material:  Prolene   Suture technique:  Running locked  Number of sutures:  7 Approximation:    Approximation:  Close Repair type:    Repair type:  Simple Post-procedure details:     Dressing:  Bulky dressing   Procedure completion:  Tolerated well, no immediate complications     Medications Ordered in ED Medications  lidocaine (PF) (XYLOCAINE) 1 % injection 5 mL (has no administration in time range)    ED Course/ Medical Decision Making/ A&P Clinical Course as of 11/12/21 0533  Tue Nov 12, 2021  0402 DG Knee Complete 4 Views Left [AH]    Clinical Course User Index [AH] Margarita Mail, PA-C                           Medical Decision Making Patient here with loss of consciousness.The differential for syncope is extensive and includes, but is not limited to: arrythmia (Vtach, SVT, SSS, sinus arrest, AV block, bradycardia) aortic stenosis, AMI, HOCM, PE, atrial myxoma, pulmonary hypertension, orthostatic hypotension, (hypovolemia, drug effect, GB syndrome, micturition, cough, swall) carotid sinus sensitivity, Seizure, TIA/CVA, hypoglycemia,  Vertigo. After evaluation of all data points patient appears to have new onset A-fib.  She does not appear to have a history of the same.  CHA2DS2-VASc score 2 making her moderate risk.  Given her history of falls, and after discussion with attending physician I have opted for high-dose aspirin.  I have also given the patient an ambulatory referral to the A-fib clinic for reevaluation and management with cardiology.  Patient is rate controlled.  I discussed all these findings with her sister at bedside.  I repaired patient's laceration to the left middle finger.  Although it appears she has an avulsion fracture on her plain film at of the radial head patient is able to twist flex and extend the elbow without any pain and I do not think she needs any treatment for this potentially extremely minimal fracture that is stable.c   Amount and/or Complexity of Data Reviewed Labs: ordered. Radiology: ordered and independent interpretation performed. Decision-making details documented in ED Course.    Details: I ordered and interpreted CT head  and C-spine both of which negative for acute findings.  I ordered and interpreted plain films of left hand, left elbow and left knee.  Left elbow shows avulsion fracture however patient clinically does not appear to have such. ECG/medicine tests: ordered and independent interpretation performed.    Details: rate controlled afib  Risk OTC drugs. Prescription drug management.          Final Clinical Impression(s) / ED Diagnoses Final diagnoses:  Syncope, unspecified syncope type  Laceration of left middle finger without foreign body without damage to nail, initial encounter  Abrasions of multiple sites  Skin tear of left elbow without complication, initial encounter    Rx / DC Orders ED Discharge Orders     None         Margarita Mail, PA-C 11/12/21 [place], [place], DO 11/20/21 [phone]

## 2021-11-15 ENCOUNTER — Telehealth (HOSPITAL_COMMUNITY): Payer: Self-pay

## 2021-11-15 NOTE — Telephone Encounter (Signed)
Contacted [facility] regarding appointment. She is scheduled for ED follow up appointment on 8/21 @ 2:00pm to see Rudi Coco -NP at the [facility]. Nurse given directions to locate our clinic. Communicated with nursing staff and she verbalized understanding.

## 2021-11-18 ENCOUNTER — Ambulatory Visit (HOSPITAL_COMMUNITY)
Admission: RE | Admit: 2021-11-18 | Discharge: 2021-11-18 | Disposition: A | Discharge status: Home / Self Care | Payer: Commercial Managed Care - HMO | Source: Ambulatory Visit | Attending: Nurse Practitioner | Admitting: Nurse Practitioner

## 2021-11-18 DIAGNOSIS — I4891 Unspecified atrial fibrillation: Secondary | ICD-10-CM

## 2022-03-18 ENCOUNTER — Emergency Department (HOSPITAL_COMMUNITY): Payer: Medicare Other

## 2022-03-18 ENCOUNTER — Other Ambulatory Visit: Payer: Self-pay

## 2022-03-18 ENCOUNTER — Emergency Department (HOSPITAL_COMMUNITY)
Admission: EM | Admit: 2022-03-18 | Discharge: 2022-03-18 | Disposition: A | Discharge status: Skilled Nursing Facility | Payer: Medicare Other | Attending: Emergency Medicine | Admitting: Emergency Medicine

## 2022-03-18 ENCOUNTER — Encounter (HOSPITAL_COMMUNITY): Payer: Self-pay | Admitting: Emergency Medicine

## 2022-03-18 DIAGNOSIS — Z7982 Long term (current) use of aspirin: Secondary | ICD-10-CM | POA: Diagnosis not present

## 2022-03-18 DIAGNOSIS — Z794 Long term (current) use of insulin: Secondary | ICD-10-CM | POA: Diagnosis not present

## 2022-03-18 DIAGNOSIS — F840 Autistic disorder: Secondary | ICD-10-CM | POA: Diagnosis not present

## 2022-03-18 DIAGNOSIS — Z79899 Other long term (current) drug therapy: Secondary | ICD-10-CM | POA: Diagnosis not present

## 2022-03-18 DIAGNOSIS — I10 Essential (primary) hypertension: Secondary | ICD-10-CM | POA: Diagnosis not present

## 2022-03-18 DIAGNOSIS — Z7984 Long term (current) use of oral hypoglycemic drugs: Secondary | ICD-10-CM | POA: Diagnosis not present

## 2022-03-18 DIAGNOSIS — E119 Type 2 diabetes mellitus without complications: Secondary | ICD-10-CM | POA: Diagnosis not present

## 2022-03-18 DIAGNOSIS — S82302A Unspecified fracture of lower end of left tibia, initial encounter for closed fracture: Secondary | ICD-10-CM | POA: Diagnosis not present

## 2022-03-18 DIAGNOSIS — F0390 Unspecified dementia without behavioral disturbance: Secondary | ICD-10-CM | POA: Diagnosis not present

## 2022-03-18 DIAGNOSIS — S82832A Other fracture of upper and lower end of left fibula, initial encounter for closed fracture: Secondary | ICD-10-CM | POA: Diagnosis not present

## 2022-03-18 DIAGNOSIS — M25572 Pain in left ankle and joints of left foot: Secondary | ICD-10-CM | POA: Diagnosis present

## 2022-03-18 DIAGNOSIS — W050XXA Fall from non-moving wheelchair, initial encounter: Secondary | ICD-10-CM | POA: Diagnosis not present

## 2022-03-18 LAB — BASIC METABOLIC PANEL
Anion gap: 9 (ref 5–15)
BUN: 26 mg/dL — ABNORMAL HIGH (ref 8–23)
CO2: 32 mmol/L (ref 22–32)
Calcium: 8.8 mg/dL — ABNORMAL LOW (ref 8.9–10.3)
Chloride: 100 mmol/L (ref 98–111)
Creatinine, Ser: 1.17 mg/dL — ABNORMAL HIGH (ref 0.44–1.00)
GFR, Estimated: 47 mL/min — ABNORMAL LOW (ref >60)
Glucose, Bld: 142 mg/dL — ABNORMAL HIGH (ref 70–99)
Potassium: 4.6 mmol/L (ref 3.5–5.1)
Sodium: 141 mmol/L (ref 135–145)

## 2022-03-18 LAB — CBC WITH DIFFERENTIAL/PLATELET
Abs Immature Granulocytes: 0.04 10*3/uL (ref 0.00–0.07)
Basophils Absolute: 0.1 10*3/uL (ref 0.0–0.1)
Basophils Relative: 1 %
Eosinophils Absolute: 0 10*3/uL (ref 0.0–0.5)
Eosinophils Relative: 0 %
HCT: 36.2 % (ref 36.0–46.0)
Hemoglobin: 9.8 g/dL — ABNORMAL LOW (ref 12.0–15.0)
Immature Granulocytes: 0 %
Lymphocytes Relative: 10 %
Lymphs Abs: 1 10*3/uL (ref 0.7–4.0)
MCH: 26.5 pg (ref 26.0–34.0)
MCHC: 27.1 g/dL — ABNORMAL LOW (ref 30.0–36.0)
MCV: 97.8 fL (ref 80.0–100.0)
Monocytes Absolute: 0.4 10*3/uL (ref 0.1–1.0)
Monocytes Relative: 5 %
Neutro Abs: 8.2 10*3/uL — ABNORMAL HIGH (ref 1.7–7.7)
Neutrophils Relative %: 84 %
Platelets: 271 10*3/uL (ref 150–400)
RBC: 3.7 MIL/uL — ABNORMAL LOW (ref 3.87–5.11)
RDW: 16.9 % — ABNORMAL HIGH (ref 11.5–15.5)
WBC: 9.8 10*3/uL (ref 4.0–10.5)
nRBC: 0 % (ref 0.0–0.2)

## 2022-03-18 MED ORDER — OXYCODONE-ACETAMINOPHEN 5-325 MG PO TABS
1.0000 | ORAL_TABLET | Freq: Once | ORAL | Status: AC
  Administered 2022-03-18: 1 via ORAL
  Filled 2022-03-18: qty 1

## 2022-03-18 MED ORDER — MORPHINE SULFATE (PF) 4 MG/ML IV SOLN
4.0000 mg | Freq: Once | INTRAVENOUS | Status: AC
  Administered 2022-03-18: 4 mg via INTRAVENOUS
  Filled 2022-03-18: qty 1

## 2022-03-18 MED ORDER — ONDANSETRON HCL 4 MG/2ML IJ SOLN
4.0000 mg | Freq: Once | INTRAMUSCULAR | Status: AC
  Administered 2022-03-18: 4 mg via INTRAVENOUS
  Filled 2022-03-18: qty 2

## 2022-03-18 MED ORDER — ACETAMINOPHEN 500 MG PO TABS
1000.0000 mg | ORAL_TABLET | Freq: Once | ORAL | Status: AC
  Administered 2022-03-18: 1000 mg via ORAL
  Filled 2022-03-18: qty 2

## 2022-03-18 NOTE — ED Provider Notes (Signed)
[facility]-EMERGENCY DEPT Provider Note  CSN: [id number] Arrival date & time: 03/18/22 1005  Chief Complaint(s) Ankle Pain and Fall  HPI [name] is a 81 y.o. female with history of developmental delay, seizure disorder, hypertension, hyperlipidemia, diabetes nonambulatory at baseline presenting to the urgency department after fall.  The patient was apparently self transferring to the wheelchair when she fell to the floor.  The fall was unwitnessed.  She was found by staff.  She was at her baseline per facility.  Patient's sister is present and reports she is at baseline.  No clear history of seizure or shaking.  History otherwise extremely limited due to baseline developmental delay and autism.   Past Medical History Past Medical History:  Diagnosis Date   Anemia    Anxiety    Arthritis    Chronic pain    Closed right ankle fracture    Depression    Gout    Hyperlipidemia    Hypertension    Insomnia    Intellectual disability    Seizure (HCC)    Unspecified constipation    Vitamin D deficiency    Patient Active Problem List   Diagnosis Date Noted   Type II or unspecified type diabetes mellitus with neurological manifestations, not stated as uncontrolled(250.60) 10/05/2012   Gout 10/05/2012   Seizures (HCC) 10/05/2012   Essential hypertension, benign 10/05/2012   Edema 10/05/2012   Other and unspecified hyperlipidemia 10/05/2012   Chronic pain 10/05/2012   Unspecified constipation 10/05/2012   Home Medication(s) Prior to Admission medications   Medication Sig Start Date End Date Taking? Authorizing Provider  acetaminophen (TYLENOL) 325 MG tablet Take 650 mg by mouth every 6 (six) hours as needed for pain.    [provider]  allopurinol (ZYLOPRIM) 100 MG tablet Take 200 mg by mouth daily.    [provider]  allopurinol (ZYLOPRIM) 300 MG tablet Take 300 mg by mouth daily. 11/08/21   [provider]  amLODipine  (NORVASC) 2.5 MG tablet Take 2.5 mg by mouth.    [provider]  aspirin EC 325 MG tablet Take 1 tablet (325 mg total) by mouth daily. 11/12/21   Harris, Cammy Copa, PA-C  atorvastatin (LIPITOR) 20 MG tablet Take 20 mg by mouth.    [provider]  Calcium Carb-Cholecalciferol (CALCIUM-VITAMIN D) 500-200 MG-UNIT tablet Take by mouth.    [provider]  calcium-vitamin D (OSCAL WITH D) 500-200 MG-UNIT per tablet Take 1 tablet by mouth 3 (three) times daily.    [provider]  carbamazepine (TEGRETOL XR) 100 MG 12 hr tablet Take 300 mg by mouth 2 (two) times daily.    [provider]  Cholecalciferol (VITAMIN D PO) Take by mouth.    [provider]  ciprofloxacin-hydrocortisone (CIPRO HC OTIC) OTIC suspension 3 drops 2 times daily.    [provider]  clindamycin (CLEOCIN) 150 MG capsule Take by mouth. 10/08/21   [provider]  Cranberry 450 MG TABS Take 450 mg by mouth.    [provider]  docusate sodium (COLACE) 100 MG capsule Take 100 mg by mouth daily.    [provider]  fluticasone (FLONASE) 50 MCG/ACT nasal spray Place into the nose.    [provider]  fluticasone furoate-vilanterol (BREO ELLIPTA) 200-25 MCG/INH AEPB Inhale into the lungs.    [provider]  furosemide (LASIX) 20 MG tablet Take 20 mg by mouth daily.    [provider]  gabapentin (NEURONTIN) 100  MG capsule Take 200 mg by mouth 4 (four) times daily.     [provider]  HYDROcodone-acetaminophen (NORCO/VICODIN) 5-325 MG per tablet Take 1 tablet by mouth daily as needed for pain.    [provider]  insulin glargine (LANTUS) 100 UNIT/ML injection Inject 10 Units into the skin at bedtime.    [provider]  loratadine (CLARITIN) 10 MG tablet Take 10 mg by mouth daily as needed for allergies.    [provider]  losartan (COZAAR) 25 MG tablet Take 25 mg by mouth.    [provider]  Melatonin 1 MG TABS Take 1 mg by mouth.    [provider]  Menthol-Methyl Salicylate CREA Apply 1 application topically every 12 (twelve) hours.    [provider]  metaxalone (SKELAXIN) 800 MG tablet Take 800 mg by mouth 3 (three) times daily.    [provider]  metFORMIN (GLUCOPHAGE) 1000 MG tablet Take 1,000 mg by mouth 2 (two) times daily with a meal.    [provider]  metoprolol succinate (TOPROL-XL) 25 MG 24 hr tablet Take 25 mg by mouth daily.     [provider]  mirtazapine (REMERON) 15 MG tablet Take 30 mg by mouth at bedtime. 10/05/12   Gerlene Fee, NP  Multiple Vitamins-Minerals (DECUBI-VITE) CAPS Take by mouth.    [provider]  neomycin-polymyxin-hydrocortisone (CORTISPORIN) 3.5-10000-1 otic suspension Place 2 drops into the left ear 4 (four) times daily as needed.    [provider]  pantoprazole (PROTONIX) 40 MG tablet Take 40 mg by mouth daily. 10/24/21   [provider]  PHENobarbital (LUMINAL) 32.4 MG tablet Take three tablets by mouth at bedtime. 09/10/12   Lauree Chandler, NP  pravastatin (PRAVACHOL) 40 MG tablet Take 40 mg by mouth daily.    [provider]  sertraline (ZOLOFT) 50 MG tablet Take 50 mg by mouth 2 (two) times daily. 11/07/21   [provider]  sildenafil (REVATIO) 20 MG tablet Take 20 mg by mouth 3 (three) times daily. 11/05/21   [provider]  spironolactone (ALDACTONE) 50 MG tablet Take 50 mg by mouth daily. 11/05/21   [provider]  traMADol (ULTRAM) 50 MG tablet Take 50 mg by mouth every 6 (six) hours as needed. 10/15/21   [provider]  Vitamin D, Ergocalciferol, (DRISDOL) 50000 UNITS CAPS Take 50,000 Units by mouth every 30 (thirty) days.    [provider]                                                                                                                                    Past Surgical  History History reviewed. No pertinent surgical history. Family History History reviewed. No pertinent family history.  Social History Social History   Tobacco Use   Smoking status: Never   Smokeless tobacco: Never   Allergies Chocolate and Escitalopram oxalate  Review of Systems Review of Systems  Unable to perform ROS: Dementia    Physical Exam Vital Signs  I have reviewed the triage vital signs BP 133/69   Pulse 77   Temp 98.1 F (36.7 C) (Oral)   Resp 13   Ht 5\' 6"  (1.676 m)   Wt 99.8 kg   SpO2 97%   BMI 35.51 kg/m  Physical Exam Vitals and nursing note reviewed.  Constitutional:      General: She is not in acute distress.    Appearance: She is well-developed.  HENT:     Head: Normocephalic and atraumatic.     Mouth/Throat:     Mouth: Mucous membranes are moist.  Eyes:     Pupils: Pupils are equal, round, and reactive to light.  Cardiovascular:     Rate and Rhythm: Normal rate and regular rhythm.     Heart sounds: No murmur heard.    Comments: 2+ bilateral DP pulses Pulmonary:     Effort: Pulmonary effort is normal. No respiratory distress.     Breath sounds: Normal breath sounds.  Abdominal:     General: Abdomen is flat.     Palpations: Abdomen is soft.     Tenderness: There is no abdominal tenderness.  Musculoskeletal:     Right lower leg: No edema.     Left lower leg: No edema.     Comments: Obvious deformity to the left ankle.  No tenderness of the left knee or left hip.  Full range of motion without tenderness of the bilateral upper extremities and the right lower extremity.  No midline C, T, L-spine tenderness.  No chest wall tenderness or crepitus.  Skin:    General: Skin is warm and dry.  Neurological:     Mental Status: She is alert. Mental status is at baseline.     Comments: Moves all 4 extremities equally, cranial nerves grossly intact  Psychiatric:        Mood and Affect: Mood normal.        Behavior: Behavior normal.     ED  Results and Treatments Labs (all labs ordered are listed, but only abnormal results are displayed) Labs Reviewed  CBC WITH DIFFERENTIAL/PLATELET - Abnormal; Notable for the following components:      Result Value   RBC 3.70 (*)    Hemoglobin 9.8 (*)    MCHC 27.1 (*)    RDW 16.9 (*)    Neutro Abs 8.2 (*)    All other components within normal limits  BASIC METABOLIC PANEL - Abnormal; Notable for the following components:   Glucose, Bld 142 (*)    BUN 26 (*)    Creatinine, Ser 1.17 (*)    Calcium 8.8 (*)    GFR, Estimated 47 (*)    All other components within normal limits  Radiology CT Head Wo Contrast  Result Date: 03/18/2022 CLINICAL DATA:  Fall. EXAM: CT HEAD WITHOUT CONTRAST CT CERVICAL SPINE WITHOUT CONTRAST TECHNIQUE: Multidetector CT imaging of the head and cervical spine was performed following the standard protocol without intravenous contrast. Multiplanar CT image reconstructions of the cervical spine were also generated. RADIATION DOSE REDUCTION: This exam was performed according to the departmental dose-optimization program which includes automated exposure control, adjustment of the mA and/or kV according to patient size and/or use of iterative reconstruction technique. COMPARISON:  CT head and cervical spine dated November 12, 2021. FINDINGS: CT HEAD FINDINGS Brain: No evidence of acute infarction, hemorrhage, hydrocephalus, extra-axial collection or mass lesion/mass effect. Unchanged cerebral and cerebellar atrophy. Vascular: Atherosclerotic vascular calcification of the carotid siphons. No hyperdense vessel. Skull: Normal. Negative for fracture or focal lesion. Sinuses/Orbits: No acute finding. Other: None. CT CERVICAL SPINE FINDINGS Alignment: No traumatic malalignment. Unchanged straightening and mild reversal of the normal cervical lordosis. Skull base and  vertebrae: No acute fracture. Unchanged C4 lucent lesion, likely a hemangioma. Soft tissues and spinal canal: No prevertebral fluid or swelling. No visible canal hematoma. Disc levels: Unchanged moderate to severe disc height loss and uncovertebral hypertrophy from C4-C5 through T1-T2. Upper chest: New subsegmental atelectasis in the medial right lung apex. Other: None. IMPRESSION: 1. No acute intracranial abnormality. 2. No acute cervical spine fracture or traumatic malalignment. Electronically Signed   By: Titus Dubin M.D.   On: 03/18/2022 11:52   CT Cervical Spine Wo Contrast  Result Date: 03/18/2022 CLINICAL DATA:  Fall. EXAM: CT HEAD WITHOUT CONTRAST CT CERVICAL SPINE WITHOUT CONTRAST TECHNIQUE: Multidetector CT imaging of the head and cervical spine was performed following the standard protocol without intravenous contrast. Multiplanar CT image reconstructions of the cervical spine were also generated. RADIATION DOSE REDUCTION: This exam was performed according to the departmental dose-optimization program which includes automated exposure control, adjustment of the mA and/or kV according to patient size and/or use of iterative reconstruction technique. COMPARISON:  CT head and cervical spine dated November 12, 2021. FINDINGS: CT HEAD FINDINGS Brain: No evidence of acute infarction, hemorrhage, hydrocephalus, extra-axial collection or mass lesion/mass effect. Unchanged cerebral and cerebellar atrophy. Vascular: Atherosclerotic vascular calcification of the carotid siphons. No hyperdense vessel. Skull: Normal. Negative for fracture or focal lesion. Sinuses/Orbits: No acute finding. Other: None. CT CERVICAL SPINE FINDINGS Alignment: No traumatic malalignment. Unchanged straightening and mild reversal of the normal cervical lordosis. Skull base and vertebrae: No acute fracture. Unchanged C4 lucent lesion, likely a hemangioma. Soft tissues and spinal canal: No prevertebral fluid or swelling. No visible  canal hematoma. Disc levels: Unchanged moderate to severe disc height loss and uncovertebral hypertrophy from C4-C5 through T1-T2. Upper chest: New subsegmental atelectasis in the medial right lung apex. Other: None. IMPRESSION: 1. No acute intracranial abnormality. 2. No acute cervical spine fracture or traumatic malalignment. Electronically Signed   By: Titus Dubin M.D.   On: 03/18/2022 11:52   DG Foot Complete Left  Result Date: 03/18/2022 CLINICAL DATA:  Pain after fall EXAM: LEFT FOOT - COMPLETE 3+ VIEW COMPARISON:  None Available. FINDINGS: A comminuted displaced fracture through the distal fibular diaphysis is identified. Irregularity near the distal tip of the fibula suggest fracture as well. A comminuted displaced fracture of the distal tibial diaphysis is identified. No fractures identified in the bones of the feet. IMPRESSION: 1. Comminuted displaced fractures of the distal tibia and fibula. Irregularity near the most distal aspect of the fibula suggest fracture in this location,  not identified on the act call x-rays. 2. No fractures identified in the bones of the feet. Electronically Signed   By: Dorise Bullion III M.D.   On: 03/18/2022 11:42   DG Ankle Complete Left  Result Date: 03/18/2022 CLINICAL DATA:  Pain after fall EXAM: LEFT ANKLE COMPLETE - 3+ VIEW COMPARISON:  None Available. FINDINGS: There is a comminuted mildly displaced fracture of the distal fibular diaphysis. There is a comminuted displaced fracture through the distal tibial diaphysis. A true lateral view could not be obtained limiting evaluation of the hindfoot including the talus and calcaneus. IMPRESSION: 1. Comminuted mildly displaced fracture of the distal fibular diaphysis. 2. Comminuted displaced fracture through the distal tibial diaphysis. 3. A true lateral view could not be obtained limiting evaluation of the hindfoot. See the foot x-ray from today for better evaluation of the foot. Electronically Signed   By:  Dorise Bullion III M.D.   On: 03/18/2022 11:39    Pertinent labs & imaging results that were available during my care of the patient were reviewed by me and considered in my medical decision making (see MDM for details).  Medications Ordered in ED Medications  acetaminophen (TYLENOL) tablet 1,000 mg (1,000 mg Oral Given 03/18/22 1051)  ondansetron (ZOFRAN) injection 4 mg (4 mg Intravenous Given 03/18/22 1235)  morphine (PF) 4 MG/ML injection 4 mg (4 mg Intravenous Given 03/18/22 1238)  oxyCODONE-acetaminophen (PERCOCET/ROXICET) 5-325 MG per tablet 1 tablet (1 tablet Oral Given 03/18/22 1511)                                                                                                                                     Procedures Procedures  (including critical care time)  Medical Decision Making / ED Course   MDM:  81 year old female presenting after unwitnessed fall.  Patient well-appearing, per sister patient at baseline mental status.  Examination reveals obvious deformity to the left ankle with intact distal pulses.  Will obtain x-ray.  Will also obtain CT head and neck given unwitnessed fall.  Doubt seizure, no postictal period, found by staff after fall in process of self transferring to wheelchair.  Will reassess.  Clinical Course as of 03/18/22 1518  Tue Mar 18, 2022  1205 X-ray shows comminuted distal tib-fib fracture.  Will discuss with orthopedic surgery. CT head and neck negative for intracranial or cervical process [WS]  1314 Discussed with Dr. Kathaleen Bury, recommends short leg splint and CT scan. Will discuss with trauma team.  [WS]  33 Dr. Kathaleen Bury discussed with orthopedic trauma service. He thinks it is better to manage non-operatively given the high risk of morbidity of surgery due to chronic comorbidities. Will splint and re-assess. Will obtain CT prior to discharge. Will discharge to SNF, strict non-weigth bearing instructions, elevate leg, and return for any  worsening pain or discomfort which may indicate compartment syndrome [WS]  1509 Splint applied.  I reevaluated patient.  She  has no pain at rest.  She has good cap refill.  Discussed follow-up as outpatient with the family who are agreeable.  Discussed very strict return precautions and frequent checks at her nursing facility, if she were to develop any worsening pain she should return given risk of possible compartment syndrome with tibia fracture.  No signs of compartment syndrome at this time. Will discharge patient to home. All questions answered. Sister comfortable with plan of discharge. Return precautions discussed with sister and specified on the after visit summary.  [WS]    Clinical Course User Index [WS] Cristie Hem, MD     Additional history obtained: -Additional history obtained from family and caregiver -External records from outside source obtained and reviewed including: Chart review including previous notes, labs, imaging, consultation notes including ED visit 11/12/21   Lab Tests: -I ordered, reviewed, and interpreted labs.   The pertinent results include:   Labs Reviewed  CBC WITH DIFFERENTIAL/PLATELET - Abnormal; Notable for the following components:      Result Value   RBC 3.70 (*)    Hemoglobin 9.8 (*)    MCHC 27.1 (*)    RDW 16.9 (*)    Neutro Abs 8.2 (*)    All other components within normal limits  BASIC METABOLIC PANEL - Abnormal; Notable for the following components:   Glucose, Bld 142 (*)    BUN 26 (*)    Creatinine, Ser 1.17 (*)    Calcium 8.8 (*)    GFR, Estimated 47 (*)    All other components within normal limits    Notable for mild AKI, chronic anemia   Imaging Studies ordered: I ordered imaging studies including CT head and neck, XR left ankle On my interpretation imaging demonstrates left ankle tib/fib fx  I independently visualized and interpreted imaging. I agree with the radiologist interpretation   Medicines ordered and  prescription drug management: Meds ordered this encounter  Medications   acetaminophen (TYLENOL) tablet 1,000 mg   ondansetron (ZOFRAN) injection 4 mg   morphine (PF) 4 MG/ML injection 4 mg   oxyCODONE-acetaminophen (PERCOCET/ROXICET) 5-325 MG per tablet 1 tablet    -I have reviewed the patients home medicines and have made adjustments as needed   Consultations Obtained: I requested consultation with the orthopedistt,  and discussed lab and imaging findings as well as pertinent plan - they recommend: Discharge to facility, nonoperative management given significant comorbidities and patient nonambulatory.   Social Determinants of Health:  Diagnosis or treatment significantly limited by social determinants of health: obesity   Reevaluation: After the interventions noted above, I reevaluated the patient and found that they have improved  Co morbidities that complicate the patient evaluation  Past Medical History:  Diagnosis Date   Anemia    Anxiety    Arthritis    Chronic pain    Closed right ankle fracture    Depression    Gout    Hyperlipidemia    Hypertension    Insomnia    Intellectual disability    Seizure ([place])    Unspecified constipation    Vitamin D deficiency       Dispostion: Disposition decision including need for hospitalization was considered, and patient discharged from emergency department.    Final Clinical Impression(s) / ED Diagnoses Final diagnoses:  Closed fracture of distal end of left fibula and tibia, initial encounter     This chart was dictated using voice recognition software.  Despite best efforts to proofread,  errors can occur which  can change the documentation meaning.    Cristie Hem, MD 03/18/22 [phone]

## 2022-03-18 NOTE — ED Notes (Signed)
[facility] notified of need for pt transport 

## 2022-03-18 NOTE — ED Triage Notes (Signed)
Pt via EMS from [facility] on [address] c/o left ankle pain and deformity after unwitnessed fall. Pt has hx DM2, cancer, autism, and IDD and is on comfort care for cancer dx. 22ga IV in left AC placed by EMS with fentanyl and 4mg  zofran given en route. Pt is acting to baseline per facility staff. Her sister is present at the bedside. Sister states pt is nonambulatory at baseline and is wheelchair bound.   O2 97% on 5L, baseline O2 requirement BP 136/96 HR 90

## 2022-03-18 NOTE — Discharge Instructions (Signed)
We evaluated [name] for her ankle pain.  We did not find signs of any head injury.  Her left tibia and left fibula bones of the ankle were broken from her fall.  We placed her in a splint.  Since [name] does not walk, and she has medical comorbidities, Dr.Ramanathan feels the risks of surgery including infection are higher than the benefits of performing the surgery.  We have placed [name] in a splint.  Please keep a close eye on her leg.  Keep her leg elevated at all times if possible.  Please give her Tylenol for pain as needed.  She should not bear any weight on her left leg.  If [name] develops any worsening symptoms, such as increasing or severe pain, swelling, cold toes or inability to move the toes, numbness or tingling in the toes or leg, fevers or chills, chest pain, shortness of breath, or any other concerning symptoms, she should return to the emergency department for repeat evaluation.

## 2022-05-08 ENCOUNTER — Encounter (HOSPITAL_COMMUNITY): Payer: Self-pay | Admitting: *Deleted

## 2023-05-30 DEATH — deceased
